# Patient Record
Sex: Male | Born: 1980 | Hispanic: Yes | Marital: Married | Smoking: Never smoker
Health system: Southern US, Community
[De-identification: ages and names within clinical notes are randomized; demographics above are authoritative.]

## PROBLEM LIST (undated history)

## (undated) DIAGNOSIS — Q250 Patent ductus arteriosus: Secondary | ICD-10-CM

## (undated) DIAGNOSIS — E785 Hyperlipidemia, unspecified: Secondary | ICD-10-CM

## (undated) HISTORY — DX: Hyperlipidemia, unspecified: E78.5

## (undated) HISTORY — DX: Patent ductus arteriosus: Q25.0

---

## 2020-04-19 ENCOUNTER — Other Ambulatory Visit: Payer: Self-pay

## 2020-04-19 ENCOUNTER — Emergency Department (HOSPITAL_COMMUNITY)
Admission: EM | Admit: 2020-04-19 | Discharge: 2020-04-19 | Disposition: A | Discharge status: Home / Self Care | Payer: 59 | Attending: Emergency Medicine | Admitting: Emergency Medicine

## 2020-04-19 ENCOUNTER — Emergency Department (HOSPITAL_COMMUNITY): Payer: 59

## 2020-04-19 ENCOUNTER — Encounter (HOSPITAL_COMMUNITY): Payer: Self-pay | Admitting: *Deleted

## 2020-04-19 DIAGNOSIS — R079 Chest pain, unspecified: Secondary | ICD-10-CM

## 2020-04-19 DIAGNOSIS — R0602 Shortness of breath: Secondary | ICD-10-CM | POA: Insufficient documentation

## 2020-04-19 DIAGNOSIS — R0789 Other chest pain: Secondary | ICD-10-CM | POA: Insufficient documentation

## 2020-04-19 LAB — CBC
HCT: 45.6 % (ref 39.0–52.0)
Hemoglobin: 15.5 g/dL (ref 13.0–17.0)
MCH: 30.6 pg (ref 26.0–34.0)
MCHC: 34 g/dL (ref 30.0–36.0)
MCV: 90.1 fL (ref 80.0–100.0)
Platelets: 253 10*3/uL (ref 150–400)
RBC: 5.06 MIL/uL (ref 4.22–5.81)
RDW: 12.7 % (ref 11.5–15.5)
WBC: 6.4 10*3/uL (ref 4.0–10.5)
nRBC: 0 % (ref 0.0–0.2)

## 2020-04-19 LAB — BASIC METABOLIC PANEL
Anion gap: 10 (ref 5–15)
BUN: 9 mg/dL (ref 6–20)
CO2: 24 mmol/L (ref 22–32)
Calcium: 9.2 mg/dL (ref 8.9–10.3)
Chloride: 106 mmol/L (ref 98–111)
Creatinine, Ser: 0.95 mg/dL (ref 0.61–1.24)
GFR, Estimated: >60 mL/min (ref >60)
Glucose, Bld: 161 mg/dL — ABNORMAL HIGH (ref 70–99)
Potassium: 3.6 mmol/L (ref 3.5–5.1)
Sodium: 140 mmol/L (ref 135–145)

## 2020-04-19 LAB — BRAIN NATRIURETIC PEPTIDE: B Natriuretic Peptide: 14.8 pg/mL (ref 0.0–100.0)

## 2020-04-19 LAB — TROPONIN I (HIGH SENSITIVITY)
Troponin I (High Sensitivity): 4 ng/L (ref <18)
Troponin I (High Sensitivity): 3 ng/L (ref <18)

## 2020-04-19 NOTE — ED Triage Notes (Signed)
Used interpretor, pt reports having chest pain x 2 months with cough/sob.

## 2020-04-19 NOTE — ED Notes (Signed)
Patient transported to X-ray, radiology to transport pt to treatment room when finished.

## 2020-04-19 NOTE — ED Notes (Signed)
Patient discharge instructions reviewed with the patient. The patient verbalized understanding of instructions. Patient discharged. 

## 2020-04-19 NOTE — ED Provider Notes (Signed)
[place] EMERGENCY DEPARTMENT Provider Note  CSN: [id number] Arrival date & time: 04/19/20 2440    History Chief Complaint  Patient presents with   Chest Pain    HPI History via interpreter  Julian Dickson is a 40 y.o. male with no significant PMH reports about 2 months of intermittent episodes of chest pain, described as a pressure/squeezing or sharp, comes and goes, lasting about 28min per episodes. He has the pains at least every other day, associated with SOB particularly with walking. Denies any cough or fever but he did have Covid in October prior to these symptoms starting. He recovered from that without issue. He denies any history of HTN, DM or HLD, no smoking, no family history of premature CAD.    History reviewed. No pertinent past medical history.  History reviewed. No pertinent surgical history.  History reviewed. No pertinent family history.  Social History   Tobacco Use   Smoking status: Never Smoker   Smokeless tobacco: Never Used  Substance Use Topics   Alcohol use: Never   Drug use: Never     Home Medications Prior to Admission medications   Not on File     Allergies    Patient has no known allergies.   Review of Systems   Review of Systems A comprehensive review of systems was completed and negative except as noted in HPI.    Physical Exam BP (!) 133/95 (BP Location: Right Arm)    Pulse 69    Temp 98.3 F (36.8 C) (Oral)    Resp 16    SpO2 100%   Physical Exam Vitals and nursing note reviewed.  Constitutional:      Appearance: Normal appearance.  HENT:     Head: Normocephalic and atraumatic.     Nose: Nose normal.     Mouth/Throat:     Mouth: Mucous membranes are moist.  Eyes:     Extraocular Movements: Extraocular movements intact.     Conjunctiva/sclera: Conjunctivae normal.  Cardiovascular:     Rate and Rhythm: Normal rate.  Pulmonary:     Effort: Pulmonary effort is normal.     Breath sounds: Normal breath sounds.   Abdominal:     General: Abdomen is flat.     Palpations: Abdomen is soft.     Tenderness: There is no abdominal tenderness.  Musculoskeletal:        General: No swelling. Normal range of motion.     Cervical back: Neck supple.  Skin:    General: Skin is warm and dry.  Neurological:     General: No focal deficit present.     Mental Status: He is alert.  Psychiatric:        Mood and Affect: Mood normal.      ED Results / Procedures / Treatments   Labs (all labs ordered are listed, but only abnormal results are displayed) Labs Reviewed  BASIC METABOLIC PANEL - Abnormal; Notable for the following components:      Result Value   Glucose, Bld 161 (*)    All other components within normal limits  CBC  BRAIN NATRIURETIC PEPTIDE  TROPONIN I (HIGH SENSITIVITY)  TROPONIN I (HIGH SENSITIVITY)    EKG EKG Interpretation  Date/Time:  Monday April 19 2020 09:31:09 EST Ventricular Rate:  75 PR Interval:  166 QRS Duration: 104 QT Interval:  366 QTC Calculation: 408 R Axis:   -12 Text Interpretation: Normal sinus rhythm Incomplete right bundle branch block ST & T wave abnormality, consider  anterior ischemia Abnormal ECG No old tracing to compare Confirmed by Calvert Cantor [phone]) on 04/19/2020 9:43:47 AM   Radiology DG Chest 2 View  Result Date: 04/19/2020 CLINICAL DATA:  Chest pain EXAM: CHEST - 2 VIEW COMPARISON:  None. FINDINGS: Lungs are clear. Heart size and pulmonary vascularity are normal. No adenopathy. No pneumothorax. No bone lesions. IMPRESSION: Lungs clear.  Cardiac silhouette normal. Electronically Signed   By: Lowella Grip III M.D.   On: 04/19/2020 09:54    Procedures Procedures  Medications Ordered in the ED Medications - No data to display   MDM Rules/Calculators/A&P MDM Patient with symptoms concerning for cardiac etiology of his pain although has been ongoing for about 2 month, less likely to be ACS/MI. Could be late effects of Covid infection in  October as well. Will check labs, CXR. EKG with nonspecific ST-T abnormalities but no old to compare.  ED Course  I have reviewed the triage vital signs and the nursing notes.  Pertinent labs & imaging results that were available during my care of the patient were reviewed by me and considered in my medical decision making (see chart for details).  Clinical Course as of 04/20/20 0746  Mon Apr 19, 2020  1049 CBC is normal.  [CS]  7517 BMP with mild hyperglycemia not fasting. Trop neg x 2, BNP is normal. No signs of ACS or MI. Recommend outpatient follow up with PCP and/or Cardiology for further evaluation. Recommend return to the ED if symptoms worsen or for any other concerns.  [CS]    Clinical Course User Index [CS] Truddie Hidden, MD    Final Clinical Impression(s) / ED Diagnoses Final diagnoses:  Nonspecific chest pain    Rx / DC Orders ED Discharge Orders    None       Truddie Hidden, MD 04/20/20 [phone]

## 2020-05-17 ENCOUNTER — Encounter (HOSPITAL_BASED_OUTPATIENT_CLINIC_OR_DEPARTMENT_OTHER): Payer: Self-pay

## 2020-05-17 ENCOUNTER — Emergency Department (HOSPITAL_BASED_OUTPATIENT_CLINIC_OR_DEPARTMENT_OTHER)
Admission: EM | Admit: 2020-05-17 | Discharge: 2020-05-17 | Disposition: A | Discharge status: Home / Self Care | Payer: 59 | Attending: Emergency Medicine | Admitting: Emergency Medicine

## 2020-05-17 ENCOUNTER — Emergency Department (HOSPITAL_BASED_OUTPATIENT_CLINIC_OR_DEPARTMENT_OTHER): Payer: 59

## 2020-05-17 ENCOUNTER — Other Ambulatory Visit: Payer: Self-pay

## 2020-05-17 DIAGNOSIS — R1032 Left lower quadrant pain: Secondary | ICD-10-CM

## 2020-05-17 DIAGNOSIS — D1803 Hemangioma of intra-abdominal structures: Secondary | ICD-10-CM | POA: Insufficient documentation

## 2020-05-17 DIAGNOSIS — K659 Peritonitis, unspecified: Secondary | ICD-10-CM | POA: Diagnosis not present

## 2020-05-17 DIAGNOSIS — R079 Chest pain, unspecified: Secondary | ICD-10-CM | POA: Diagnosis not present

## 2020-05-17 DIAGNOSIS — K6389 Other specified diseases of intestine: Secondary | ICD-10-CM

## 2020-05-17 LAB — CBC
HCT: 48.9 % (ref 39.0–52.0)
Hemoglobin: 16.2 g/dL (ref 13.0–17.0)
MCH: 29.9 pg (ref 26.0–34.0)
MCHC: 33.1 g/dL (ref 30.0–36.0)
MCV: 90.4 fL (ref 80.0–100.0)
Platelets: 267 10*3/uL (ref 150–400)
RBC: 5.41 MIL/uL (ref 4.22–5.81)
RDW: 12.6 % (ref 11.5–15.5)
WBC: 7.2 10*3/uL (ref 4.0–10.5)
nRBC: 0 % (ref 0.0–0.2)

## 2020-05-17 LAB — COMPREHENSIVE METABOLIC PANEL
ALT: 25 U/L (ref 0–44)
AST: 19 U/L (ref 15–41)
Albumin: 4.6 g/dL (ref 3.5–5.0)
Alkaline Phosphatase: 79 U/L (ref 38–126)
Anion gap: 9 (ref 5–15)
BUN: 12 mg/dL (ref 6–20)
CO2: 27 mmol/L (ref 22–32)
Calcium: 9.3 mg/dL (ref 8.9–10.3)
Chloride: 104 mmol/L (ref 98–111)
Creatinine, Ser: 0.98 mg/dL (ref 0.61–1.24)
GFR, Estimated: >60 mL/min (ref >60)
Glucose, Bld: 131 mg/dL — ABNORMAL HIGH (ref 70–99)
Potassium: 3.6 mmol/L (ref 3.5–5.1)
Sodium: 140 mmol/L (ref 135–145)
Total Bilirubin: 0.5 mg/dL (ref 0.3–1.2)
Total Protein: 7.6 g/dL (ref 6.5–8.1)

## 2020-05-17 LAB — URINALYSIS, ROUTINE W REFLEX MICROSCOPIC
Bilirubin Urine: NEGATIVE
Glucose, UA: NEGATIVE mg/dL
Ketones, ur: NEGATIVE mg/dL
Leukocytes,Ua: NEGATIVE
Nitrite: NEGATIVE
Protein, ur: NEGATIVE mg/dL
Specific Gravity, Urine: >1.03 — ABNORMAL HIGH (ref 1.005–1.030)
pH: 5.5 (ref 5.0–8.0)

## 2020-05-17 LAB — URINALYSIS, MICROSCOPIC (REFLEX)

## 2020-05-17 LAB — TROPONIN I (HIGH SENSITIVITY): Troponin I (High Sensitivity): 2 ng/L (ref <18)

## 2020-05-17 LAB — LIPASE, BLOOD: Lipase: 36 U/L (ref 11–51)

## 2020-05-17 MED ORDER — KETOROLAC TROMETHAMINE 30 MG/ML IJ SOLN
30.0000 mg | Freq: Once | INTRAMUSCULAR | Status: AC
  Administered 2020-05-17: 30 mg via INTRAVENOUS
  Filled 2020-05-17: qty 1

## 2020-05-17 MED ORDER — ONDANSETRON HCL 4 MG/2ML IJ SOLN
4.0000 mg | Freq: Once | INTRAMUSCULAR | Status: AC
  Administered 2020-05-17: 4 mg via INTRAVENOUS
  Filled 2020-05-17: qty 2

## 2020-05-17 MED ORDER — MORPHINE SULFATE (PF) 4 MG/ML IV SOLN
4.0000 mg | Freq: Once | INTRAVENOUS | Status: AC
  Administered 2020-05-17: 4 mg via INTRAVENOUS
  Filled 2020-05-17: qty 1

## 2020-05-17 MED ORDER — IOHEXOL 300 MG/ML  SOLN
100.0000 mL | Freq: Once | INTRAMUSCULAR | Status: AC | PRN
  Administered 2020-05-17: 100 mL via INTRAVENOUS

## 2020-05-17 MED ORDER — SODIUM CHLORIDE 0.9 % IV BOLUS
1000.0000 mL | Freq: Once | INTRAVENOUS | Status: AC
  Administered 2020-05-17: 1000 mL via INTRAVENOUS

## 2020-05-17 NOTE — Discharge Instructions (Signed)
Your CT showed findings of epiploic appendagitis which is inflammation of fat sacs that you have hanging off of your colon. It is recommended that you take 600 mg Ibuprofen every 6-8 hours as needed for pain.   There was also an incidental finding of a mass on your liver that is like benign.   Please follow up with your PCP regarding your ED visit and for recheck of symptoms in 1 week.   Return to the ED for any worsening symptoms

## 2020-05-17 NOTE — ED Notes (Signed)
Patient transported to CT 

## 2020-05-17 NOTE — ED Triage Notes (Addendum)
Through video interpreter-Pt c/o left side abd pain x 3 days diarrhea x "quite some time"-chest pain since Dec 2021-seen by cards PTA-states he is scheduled for cardiac tests next month-was advised to come to ED for abd pain-pt NAD-steady gait

## 2020-05-17 NOTE — ED Provider Notes (Signed)
[place] EMERGENCY DEPARTMENT Provider Note   CSN: [id number] Arrival date & time: 05/17/20  1720     History Chief Complaint  Patient presents with  . Abdominal Pain  . Chest Pain    Julian Dickson is a 40 y.o. male who presents to the ED today from cardiology office with complaint of left flank/LLQ abdominal pain for the past 3 days. Pt was at the cardiologist office today for chest pain for months when he mentioned he was having abdominal pain as well and was advised to come to the ED for further evaluation. No other complaints at this time. He reports history of kidney stones however feels like is different pain. He has chronic intermittent issues with urinary urgency however denies any worsening symptoms with this pain. No fevers, chills, nausea, vomiting, diarrhea, testicular pain, or any other associated symptoms.   The history is provided by the patient and medical records.       History reviewed. No pertinent past medical history.  There are no problems to display for this patient.   History reviewed. No pertinent surgical history.     No family history on file.  Social History   Tobacco Use  . Smoking status: Never Smoker  . Smokeless tobacco: Never Used  Vaping Use  . Vaping Use: Never used  Substance Use Topics  . Alcohol use: Never  . Drug use: Never    Home Medications Prior to Admission medications   Not on File    Allergies    Patient has no known allergies.  Review of Systems   Review of Systems  Constitutional: Negative for chills and fever.  Gastrointestinal: Positive for abdominal pain. Negative for diarrhea, nausea and vomiting.  Genitourinary: Positive for flank pain.  All other systems reviewed and are negative.   Physical Exam Updated Vital Signs BP (!) 153/102 (BP Location: Right Arm)   Pulse 66   Temp 98.8 F (37.1 C) (Oral)   Resp 14   Ht 5\' 7"  (1.702 m)   Wt 66.7 kg   SpO2 99%   BMI 23.02 kg/m   Physical  Exam Vitals and nursing note reviewed.  Constitutional:      Appearance: He is not ill-appearing or diaphoretic.  HENT:     Head: Normocephalic and atraumatic.  Eyes:     Conjunctiva/sclera: Conjunctivae normal.  Cardiovascular:     Rate and Rhythm: Normal rate and regular rhythm.     Heart sounds: Normal heart sounds.  Pulmonary:     Effort: Pulmonary effort is normal.     Breath sounds: Normal breath sounds. No wheezing, rhonchi or rales.  Abdominal:     Palpations: Abdomen is soft.     Tenderness: There is abdominal tenderness in the left lower quadrant. There is no guarding or rebound.     Comments: Soft, + LLQ and left flank TTP with very minimal left CVA TTP, +BS throughout, no r/g/r, neg murphy's, neg mcburney's  Musculoskeletal:     Cervical back: Neck supple.  Skin:    General: Skin is warm and dry.  Neurological:     Mental Status: He is alert.     ED Results / Procedures / Treatments   Labs (all labs ordered are listed, but only abnormal results are displayed) Labs Reviewed  COMPREHENSIVE METABOLIC PANEL - Abnormal; Notable for the following components:      Result Value   Glucose, Bld 131 (*)    All other components within normal limits  URINALYSIS, ROUTINE W REFLEX MICROSCOPIC - Abnormal; Notable for the following components:   Specific Gravity, Urine >1.030 (*)    Hgb urine dipstick SMALL (*)    All other components within normal limits  URINALYSIS, MICROSCOPIC (REFLEX) - Abnormal; Notable for the following components:   Bacteria, UA RARE (*)    All other components within normal limits  LIPASE, BLOOD  CBC  TROPONIN I (HIGH SENSITIVITY)    EKG EKG Interpretation  Date/Time:  Monday May 17 2020 18:05:43 EDT Ventricular Rate:  72 PR Interval:  162 QRS Duration: 94 QT Interval:  400 QTC Calculation: 438 R Axis:   -25 Text Interpretation: Normal sinus rhythm with sinus arrhythmia Normal ECG No significant change since last tracing Confirmed by  Calvert Cantor [phone]) on 05/17/2020 7:32:37 PM   Radiology CT Abdomen Pelvis W Contrast  Result Date: 05/17/2020 CLINICAL DATA:  Left lower quadrant pain EXAM: CT ABDOMEN AND PELVIS WITH CONTRAST TECHNIQUE: Multidetector CT imaging of the abdomen and pelvis was performed using the standard protocol following bolus administration of intravenous contrast. CONTRAST:  172mL OMNIPAQUE IOHEXOL 300 MG/ML  SOLN COMPARISON:  None. FINDINGS: Lower chest: Lung bases demonstrate no acute consolidation or effusion. Normal cardiac size. Hepatobiliary: 3.7 x 3.8 cm hypodense mass within the right hepatic lobe with globular peripheral enhancement, felt most consistent with hemangioma. Hepatic steatosis with fat sparing near the gallbladder fossa. Subcentimeter hypodense liver lesions too small to further characterize. No calcified gallstone or biliary dilatation. Pancreas: Unremarkable. No pancreatic ductal dilatation or surrounding inflammatory changes. Spleen: Normal in size without focal abnormality. Adrenals/Urinary Tract: Adrenal glands are unremarkable. Kidneys are normal, without renal calculi, focal lesion, or hydronephrosis. Bladder is unremarkable. Stomach/Bowel: The stomach is nonenlarged. No dilated small bowel. Negative appendix. Focal inflammatory process adjacent to the descending colon. This appears to contain central fatty density. Negative for extraluminal gas. Vascular/Lymphatic: Nonaneurysmal aorta.  No suspicious adenopathy Reproductive: Prostate is unremarkable. Other: Negative for free air or free fluid Musculoskeletal: No acute or significant osseous findings. IMPRESSION: 1. Focal inflammatory process adjacent to the descending colon, appearance is suspicious for epiploic appendagitis. 2. Hepatic steatosis. 3.7 cm hypodense mass in the right hepatic lobe with globular peripheral enhancement, felt most consistent with hemangioma. Subcentimeter hypodense liver lesions too small to further characterize.  Electronically Signed   By: Donavan Foil M.D.   On: 05/17/2020 19:57    Procedures Procedures   Medications Ordered in ED Medications  ketorolac (TORADOL) 30 MG/ML injection 30 mg (has no administration in time range)  morphine 4 MG/ML injection 4 mg (4 mg Intravenous Given 05/17/20 1924)  iohexol (OMNIPAQUE) 300 MG/ML solution 100 mL (100 mLs Intravenous Contrast Given 05/17/20 1931)  sodium chloride 0.9 % bolus 1,000 mL (1,000 mLs Intravenous New Bag/Given 05/17/20 1950)  ondansetron (ZOFRAN) injection 4 mg (4 mg Intravenous Given 05/17/20 2007)    ED Course  I have reviewed the triage vital signs and the nursing notes.  Pertinent labs & imaging results that were available during my care of the patient were reviewed by me and considered in my medical decision making (see chart for details).    MDM Rules/Calculators/A&P                          40 year old [place] speaking male who presents to the ED today with cardiology after complaining of left flank/left lower quadrant pain for the past 3 days.  Advised to come here for further evaluation.  Denies  any other symptoms.  On arrival to the ED vitals are stable.  Patient is afebrile, nontachycardic and nontachypneic and appears to be in no acute distress.  He had lab work done while in the waiting room including an EKG and troponin given he was at the cardiologist office with complaint of chest pain however this has been ongoing for several months.  CBC without leukocytosis and hemoglobin stable at 6.2.  CMP with a glucose 131, no other electrolyte abnormalities.  Lipase within normal limits at 36.  Urinalysis with increased specific gravity, small hemoglobin per dipstick however none seen on high-power field.  Patient does report history of kidney stones however states this pain feels completely different.  He has had to have stones removed via stenting in the past.  He has chronic intermittent issues with urinary urgency however denies any  worsening symptoms today.  On exam patient is noted to be tender in the left lower quadrant as well as the left flank.  Very minimal tenderness to the left CVA area.  Differential diagnosis includes kidney stone versus diverticulitis.  Given lipase is of the normal limits I doubt pancreatitis today.  Patient denies heavy drinking.  Will work-up with CT given and pelvis for further evaluation.  Will provide fluids given increased [place] gravity as well as morphine for pain.    CT scan: IMPRESSION:  1. Focal inflammatory process adjacent to the descending colon,  appearance is suspicious for epiploic appendagitis.  2. Hepatic steatosis. 3.7 cm hypodense mass in the right hepatic  lobe with globular peripheral enhancement, felt most consistent with  hemangioma. Subcentimeter hypodense liver lesions too small to  further characterize.   Toradol provided for antiinflammatory purposes. Updated patient on findings. Advised Ibuprofen as needed for pain. Pt to follow up with his PCP regarding ED visit today. He is in agreement with plan and stable for discharge home.   Final Clinical Impression(s) / ED Diagnoses Final diagnoses:  Left lower quadrant abdominal pain  Epiploic appendagitis  Hemangioma of intra-abdominal structure    Rx / DC Orders ED Discharge Orders    None       Discharge Instructions     Your CT showed findings of epiploic appendagitis which is inflammation of fat sacs that you have hanging off of your colon. It is recommended that you take 600 mg Ibuprofen every 6-8 hours as needed for pain.   There was also an incidental finding of a mass on your liver that is like benign.   Please follow up with your PCP regarding your ED visit and for recheck of symptoms in 1 week.   Return to the ED for any worsening symptoms       Eustaquio Maize, Hershal Coria 05/17/20 2045    Truddie Hidden, MD 05/17/20 2300

## 2020-11-24 ENCOUNTER — Emergency Department (HOSPITAL_BASED_OUTPATIENT_CLINIC_OR_DEPARTMENT_OTHER)
Admission: EM | Admit: 2020-11-24 | Discharge: 2020-11-25 | Disposition: A | Discharge status: Home / Self Care | Payer: 59 | Attending: Emergency Medicine | Admitting: Emergency Medicine

## 2020-11-24 ENCOUNTER — Emergency Department (HOSPITAL_BASED_OUTPATIENT_CLINIC_OR_DEPARTMENT_OTHER): Payer: 59 | Admitting: Radiology

## 2020-11-24 ENCOUNTER — Encounter (HOSPITAL_BASED_OUTPATIENT_CLINIC_OR_DEPARTMENT_OTHER): Payer: Self-pay

## 2020-11-24 ENCOUNTER — Other Ambulatory Visit: Payer: Self-pay

## 2020-11-24 DIAGNOSIS — R109 Unspecified abdominal pain: Secondary | ICD-10-CM | POA: Diagnosis not present

## 2020-11-24 DIAGNOSIS — R072 Precordial pain: Secondary | ICD-10-CM | POA: Diagnosis not present

## 2020-11-24 DIAGNOSIS — R911 Solitary pulmonary nodule: Secondary | ICD-10-CM

## 2020-11-24 LAB — CBC
HCT: 42.9 % (ref 39.0–52.0)
Hemoglobin: 14.4 g/dL (ref 13.0–17.0)
MCH: 29.9 pg (ref 26.0–34.0)
MCHC: 33.6 g/dL (ref 30.0–36.0)
MCV: 89.2 fL (ref 80.0–100.0)
Platelets: 254 10*3/uL (ref 150–400)
RBC: 4.81 MIL/uL (ref 4.22–5.81)
RDW: 12.9 % (ref 11.5–15.5)
WBC: 8.4 10*3/uL (ref 4.0–10.5)
nRBC: 0 % (ref 0.0–0.2)

## 2020-11-24 LAB — COMPREHENSIVE METABOLIC PANEL
ALT: 13 U/L (ref 0–44)
AST: 15 U/L (ref 15–41)
Albumin: 4.3 g/dL (ref 3.5–5.0)
Alkaline Phosphatase: 67 U/L (ref 38–126)
Anion gap: 12 (ref 5–15)
BUN: 13 mg/dL (ref 6–20)
CO2: 23 mmol/L (ref 22–32)
Calcium: 9.3 mg/dL (ref 8.9–10.3)
Chloride: 108 mmol/L (ref 98–111)
Creatinine, Ser: 0.95 mg/dL (ref 0.61–1.24)
GFR, Estimated: >60 mL/min (ref >60)
Glucose, Bld: 138 mg/dL — ABNORMAL HIGH (ref 70–99)
Potassium: 3.7 mmol/L (ref 3.5–5.1)
Sodium: 143 mmol/L (ref 135–145)
Total Bilirubin: 0.8 mg/dL (ref 0.3–1.2)
Total Protein: 6.6 g/dL (ref 6.5–8.1)

## 2020-11-24 LAB — LIPASE, BLOOD: Lipase: 24 U/L (ref 11–51)

## 2020-11-24 LAB — TROPONIN I (HIGH SENSITIVITY): Troponin I (High Sensitivity): 2 ng/L (ref <18)

## 2020-11-24 MED ORDER — ACETAMINOPHEN 500 MG PO TABS
1000.0000 mg | ORAL_TABLET | Freq: Once | ORAL | Status: AC
  Administered 2020-11-24: 1000 mg via ORAL
  Filled 2020-11-24: qty 2

## 2020-11-24 MED ORDER — ALUM & MAG HYDROXIDE-SIMETH 200-200-20 MG/5ML PO SUSP
30.0000 mL | Freq: Once | ORAL | Status: AC
  Administered 2020-11-24: 30 mL via ORAL
  Filled 2020-11-24: qty 30

## 2020-11-24 NOTE — ED Triage Notes (Signed)
Interpreter at Bedside during Triage:  Patient here POV from Home with CP and ABD Pain.  Patient states he began having CP approximately 2 weeks Ago. Pain is located across the Chest and is also located in the upper Portions in the ABD. Pain feels as if Pins and Needles.  Patient has Hx of Stable Angina. A&Ox4. GCS 15. Wife at Bedside.

## 2020-11-25 ENCOUNTER — Encounter (HOSPITAL_BASED_OUTPATIENT_CLINIC_OR_DEPARTMENT_OTHER): Payer: Self-pay | Admitting: Emergency Medicine

## 2020-11-25 LAB — TROPONIN I (HIGH SENSITIVITY): Troponin I (High Sensitivity): <2 ng/L (ref <18)

## 2020-11-25 MED ORDER — OMEPRAZOLE 20 MG PO CPDR: 20.0000 mg | DELAYED_RELEASE_CAPSULE | Freq: Every day | ORAL | 0 refills | Status: DC

## 2020-11-25 NOTE — ED Provider Notes (Signed)
[facility] EMERGENCY DEPT Provider Note   CSN: [id number] Arrival date & time: 11/24/20  1957     History Chief Complaint  Patient presents with   Chest Pain   Abdominal Pain    Julian Dickson is a 40 y.o. male.  The history is provided by the patient and a relative. The history is limited by a language barrier. No language interpreter was used (service is down at this time, attempted to wait but did not come back up).  Chest Pain Pain location:  Substernal area Pain quality: dull   Pain radiates to:  Does not radiate Pain severity:  Moderate Onset quality:  Gradual Duration:  10 months Timing:  Constant Progression:  Waxing and waning Chronicity:  Chronic Context: at rest   Context comment:  Worse at night Relieved by:  Nothing Worsened by:  Nothing Ineffective treatments:  None tried Associated symptoms: no fever, no lower extremity edema and no vomiting   Risk factors: male sex   No travel, no leg pain.      History reviewed. No pertinent past medical history.  There are no problems to display for this patient.   History reviewed. No pertinent surgical history.     History reviewed. No pertinent family history.  Social History   Tobacco Use   Smoking status: Never   Smokeless tobacco: Never  Vaping Use   Vaping Use: Never used  Substance Use Topics   Alcohol use: Never   Drug use: Never    Home Medications Prior to Admission medications   Not on File    Allergies    Patient has no known allergies.  Review of Systems   Review of Systems  Constitutional:  Negative for fever.  HENT:  Negative for facial swelling.   Eyes:  Negative for redness.  Respiratory:  Negative for wheezing and stridor.   Cardiovascular:  Positive for chest pain.  Gastrointestinal:  Negative for vomiting.  Musculoskeletal:  Negative for neck stiffness.  Neurological:  Negative for facial asymmetry.  Psychiatric/Behavioral:  Negative for agitation.   All  other systems reviewed and are negative.  Physical Exam Updated Vital Signs BP (!) 126/91   Pulse 69   Temp 98.2 F (36.8 C) (Oral)   Resp (!) 26   Ht 5\' 7"  (1.702 m)   Wt 66.7 kg   SpO2 100%   BMI 23.03 kg/m   Physical Exam Vitals and nursing note reviewed.  Constitutional:      General: Julian Dickson is not in acute distress.    Appearance: Normal appearance.  HENT:     Head: Normocephalic and atraumatic.     Nose: Nose normal.  Eyes:     Conjunctiva/sclera: Conjunctivae normal.     Pupils: Pupils are equal, round, and reactive to light.  Cardiovascular:     Rate and Rhythm: Normal rate and regular rhythm.     Pulses: Normal pulses.     Heart sounds: Normal heart sounds.  Pulmonary:     Effort: Pulmonary effort is normal.     Breath sounds: Normal breath sounds.  Abdominal:     General: Abdomen is flat. Bowel sounds are normal.     Palpations: Abdomen is soft.     Tenderness: There is no abdominal tenderness. There is no guarding.  Musculoskeletal:        General: Normal range of motion.     Cervical back: Normal range of motion and neck supple.  Skin:    General: Skin is warm  and dry.     Capillary Refill: Capillary refill takes less than 2 seconds.  Neurological:     General: No focal deficit present.     Mental Status: Julian Dickson is alert and oriented to person, place, and time.     Deep Tendon Reflexes: Reflexes normal.  Psychiatric:        Mood and Affect: Mood normal.        Behavior: Behavior normal.    ED Results / Procedures / Treatments   Labs (all labs ordered are listed, but only abnormal results are displayed) Results for orders placed or performed during the hospital encounter of 11/24/20  Lipase, blood  Result Value Ref Range   Lipase 24 11 - 51 U/L  Comprehensive metabolic panel  Result Value Ref Range   Sodium 143 135 - 145 mmol/L   Potassium 3.7 3.5 - 5.1 mmol/L   Chloride 108 98 - 111 mmol/L   CO2 23 22 - 32 mmol/L   Glucose, Bld 138 (H) 70 - 99  mg/dL   BUN 13 6 - 20 mg/dL   Creatinine, Ser 0.95 0.61 - 1.24 mg/dL   Calcium 9.3 8.9 - 10.3 mg/dL   Total Protein 6.6 6.5 - 8.1 g/dL   Albumin 4.3 3.5 - 5.0 g/dL   AST 15 15 - 41 U/L   ALT 13 0 - 44 U/L   Alkaline Phosphatase 67 38 - 126 U/L   Total Bilirubin 0.8 0.3 - 1.2 mg/dL   GFR, Estimated >60 >60 mL/min   Anion gap 12 5 - 15  CBC  Result Value Ref Range   WBC 8.4 4.0 - 10.5 K/uL   RBC 4.81 4.22 - 5.81 MIL/uL   Hemoglobin 14.4 13.0 - 17.0 g/dL   HCT 42.9 39.0 - 52.0 %   MCV 89.2 80.0 - 100.0 fL   MCH 29.9 26.0 - 34.0 pg   MCHC 33.6 30.0 - 36.0 g/dL   RDW 12.9 11.5 - 15.5 %   Platelets 254 150 - 400 K/uL   nRBC 0.0 0.0 - 0.2 %  Troponin I (High Sensitivity)  Result Value Ref Range   Troponin I (High Sensitivity) 2 <18 ng/L  Troponin I (High Sensitivity)  Result Value Ref Range   Troponin I (High Sensitivity) <2 <18 ng/L   DG Chest 2 View  Result Date: 11/24/2020 CLINICAL DATA:  Chest pain. EXAM: CHEST - 2 VIEW COMPARISON:  Chest x-ray 04/19/2020. FINDINGS: The heart size and mediastinal contours are within normal limits. There is no focal lung infiltrate. There is no pleural effusion or pneumothorax. There is scarring in both lung apices, unchanged. Nodular density in the left lower lung may represent a nipple shadow, but is indeterminate. The visualized skeletal structures are unremarkable. IMPRESSION: 1. No acute cardiopulmonary process. 2. Questionable nodular density in the left lower lung, possibly a nipple shadow. Follow-up repeat chest x-ray with nipple markers or follow-up chest CT recommended non emergently to exclude pulmonary nodule. Electronically Signed   By: Ronney Asters M.D.   On: 11/24/2020 20:35    EKG EKG Interpretation  Date/Time:  Wednesday November 24 2020 20:06:01 EDT Ventricular Rate:  72 PR Interval:  164 QRS Duration: 100 QT Interval:  394 QTC Calculation: 431 R Axis:   -2 Text Interpretation: Normal sinus rhythm Incomplete right bundle  branch block Confirmed by Randal Buba, Ori Kreiter ([id number]) on 11/24/2020 11:14:55 PM  Radiology DG Chest 2 View  Result Date: 11/24/2020 CLINICAL DATA:  Chest pain. EXAM: CHEST - 2  VIEW COMPARISON:  Chest x-ray 04/19/2020. FINDINGS: The heart size and mediastinal contours are within normal limits. There is no focal lung infiltrate. There is no pleural effusion or pneumothorax. There is scarring in both lung apices, unchanged. Nodular density in the left lower lung may represent a nipple shadow, but is indeterminate. The visualized skeletal structures are unremarkable. IMPRESSION: 1. No acute cardiopulmonary process. 2. Questionable nodular density in the left lower lung, possibly a nipple shadow. Follow-up repeat chest x-ray with nipple markers or follow-up chest CT recommended non emergently to exclude pulmonary nodule. Electronically Signed   By: Ronney Asters M.D.   On: 11/24/2020 20:35    Procedures Procedures   Medications Ordered in ED Medications  alum & mag hydroxide-simeth (MAALOX/MYLANTA) 200-200-20 MG/5ML suspension 30 mL (30 mLs Oral Given 11/24/20 2351)  acetaminophen (TYLENOL) tablet 1,000 mg (1,000 mg Oral Given 11/24/20 2351)    ED Course  I have reviewed the triage vital signs and the nursing notes.  Pertinent labs & imaging results that were available during my care of the patient were reviewed by me and considered in my medical decision making (see chart for details).    PERC negative wells 0, highly doubt PE in this low risk patient.  Ruled out for MI based on time course.  Based on symptoms being daily and worse at night I suspect this is GERD.  HEART score is 1 low risk for MACE.  Will start PPI and have patient follow up with PMD.    Bertha Earwood was evaluated in Emergency Department on 11/25/2020 for the symptoms described in the history of present illness. Julian Dickson was evaluated in the context of the global COVID-19 pandemic, which necessitated consideration that the patient might be at  risk for infection with the SARS-CoV-2 virus that causes COVID-19. Institutional protocols and algorithms that pertain to the evaluation of patients at risk for COVID-19 are in a state of rapid change based on information released by regulatory bodies including the CDC and federal and state organizations. These policies and algorithms were followed during the patient's care in the ED.  Final Clinical Impression(s) / ED Diagnoses Final diagnoses:  None   Return for intractable cough, coughing up blood, fevers > 100.4 unrelieved by medication, shortness of breath, intractable vomiting, chest pain, shortness of breath, weakness, numbness, changes in speech, facial asymmetry, abdominal pain, passing out, Inability to tolerate liquids or food, cough, altered mental status or any concerns. No signs of systemic illness or infection. The patient is nontoxic-appearing on exam and vital signs are within normal limits. I have reviewed the triage vital signs and the nursing notes. Pertinent labs & imaging results that were available during my care of the patient were reviewed by me and considered in my medical decision making (see chart for details). After history, exam, and medical workup I feel the patient has been appropriately medically screened and is safe for discharge home. Pertinent diagnoses were discussed with the patient. Patient was given return precautions.   Rx / DC Orders ED Discharge Orders     None        Jaysten Essner, MD 11/25/20 [phone]

## 2020-12-24 ENCOUNTER — Encounter (HOSPITAL_COMMUNITY): Payer: Self-pay | Admitting: Radiology

## 2021-03-11 ENCOUNTER — Other Ambulatory Visit (HOSPITAL_BASED_OUTPATIENT_CLINIC_OR_DEPARTMENT_OTHER): Payer: Self-pay | Admitting: Family Medicine

## 2021-03-11 DIAGNOSIS — K7689 Other specified diseases of liver: Secondary | ICD-10-CM

## 2022-02-03 DIAGNOSIS — Z419 Encounter for procedure for purposes other than remedying health state, unspecified: Secondary | ICD-10-CM | POA: Diagnosis not present

## 2022-03-06 DIAGNOSIS — Z419 Encounter for procedure for purposes other than remedying health state, unspecified: Secondary | ICD-10-CM | POA: Diagnosis not present

## 2022-04-06 DIAGNOSIS — Z419 Encounter for procedure for purposes other than remedying health state, unspecified: Secondary | ICD-10-CM | POA: Diagnosis not present

## 2022-05-05 DIAGNOSIS — Z419 Encounter for procedure for purposes other than remedying health state, unspecified: Secondary | ICD-10-CM | POA: Diagnosis not present

## 2022-06-05 DIAGNOSIS — Z419 Encounter for procedure for purposes other than remedying health state, unspecified: Secondary | ICD-10-CM | POA: Diagnosis not present

## 2022-07-05 DIAGNOSIS — Z419 Encounter for procedure for purposes other than remedying health state, unspecified: Secondary | ICD-10-CM | POA: Diagnosis not present

## 2022-07-14 NOTE — Progress Notes (Deleted)
   HPI: Julian Dickson is a 42 y.o. male, who is here today to establish care.  Former PCP: *** Last preventive routine visit: ***  Chronic medical problems: ***  Concerns today: ***  Review of Systems See other pertinent positives and negatives in HPI.  Current Outpatient Medications on File Prior to Visit  Medication Sig Dispense Refill   omeprazole (PRILOSEC) 20 MG capsule Take 1 capsule (20 mg total) by mouth daily. 30 capsule 0   No current facility-administered medications on file prior to visit.    No past medical history on file. No Known Allergies  No family history on file.  Social History   Socioeconomic History   Marital status: Married    Spouse name: Not on file   Number of children: Not on file   Years of education: Not on file   Highest education level: Not on file  Occupational History   Not on file  Tobacco Use   Smoking status: Never   Smokeless tobacco: Never  Vaping Use   Vaping Use: Never used  Substance and Sexual Activity   Alcohol use: Never   Drug use: Never   Sexual activity: Not on file  Other Topics Concern   Not on file  Social History Narrative   Not on file   Social Determinants of Health   Financial Resource Strain: Not on file  Food Insecurity: Not on file  Transportation Needs: Not on file  Physical Activity: Not on file  Stress: Not on file  Social Connections: Not on file    There were no vitals filed for this visit.  There is no height or weight on file to calculate BMI.  Physical Exam  ASSESSMENT AND PLAN: There are no diagnoses linked to this encounter.  There are no diagnoses linked to this encounter.  No follow-ups on file.  Betty G. [place], MD  [facility] office.

## 2022-07-17 ENCOUNTER — Ambulatory Visit: Payer: Medicaid Other | Admitting: Family Medicine

## 2022-08-05 DIAGNOSIS — Z419 Encounter for procedure for purposes other than remedying health state, unspecified: Secondary | ICD-10-CM | POA: Diagnosis not present

## 2022-08-23 ENCOUNTER — Ambulatory Visit: Payer: Medicaid Other | Admitting: Family Medicine

## 2022-09-04 DIAGNOSIS — Z419 Encounter for procedure for purposes other than remedying health state, unspecified: Secondary | ICD-10-CM | POA: Diagnosis not present

## 2022-09-26 IMAGING — DX DG CHEST 2V
2 series · 2 of 2 positions shown · non-contrast
Comparison: None.

CLINICAL DATA: Chest pain

EXAM:
CHEST - 2 VIEW

[chest pa]
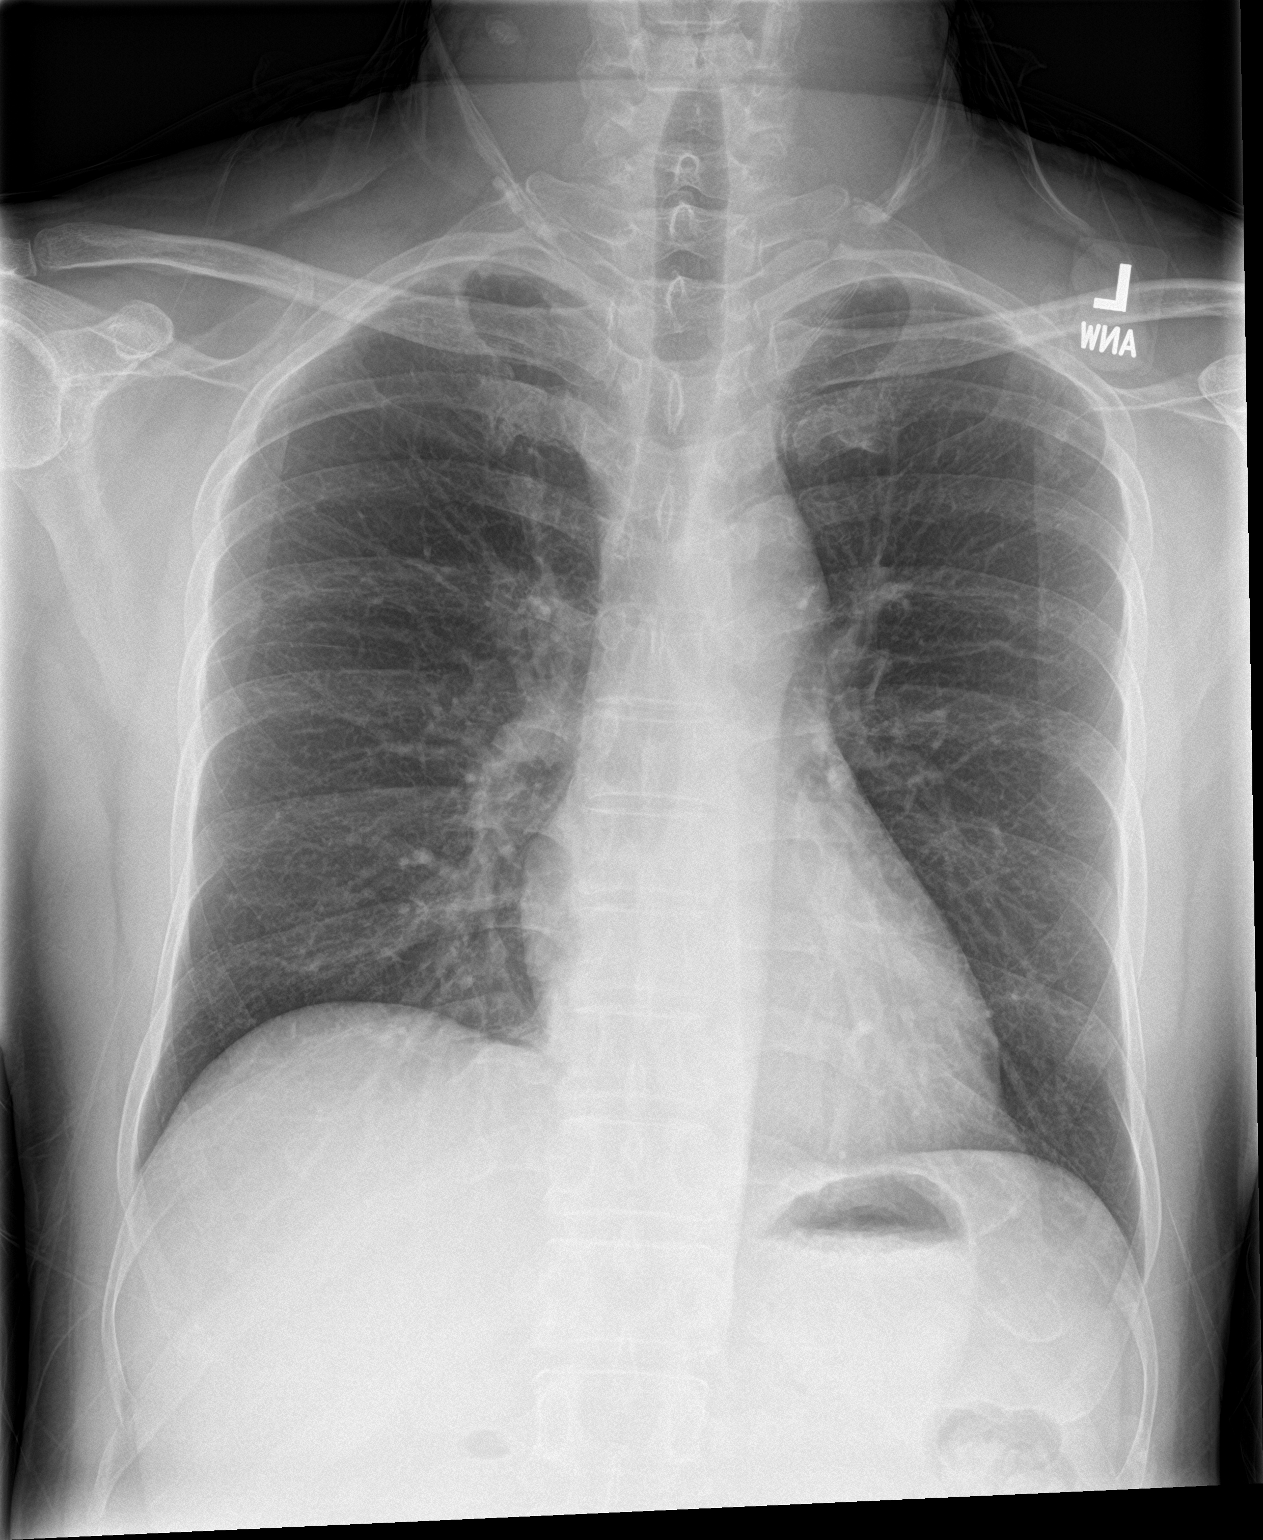

[chest lat]
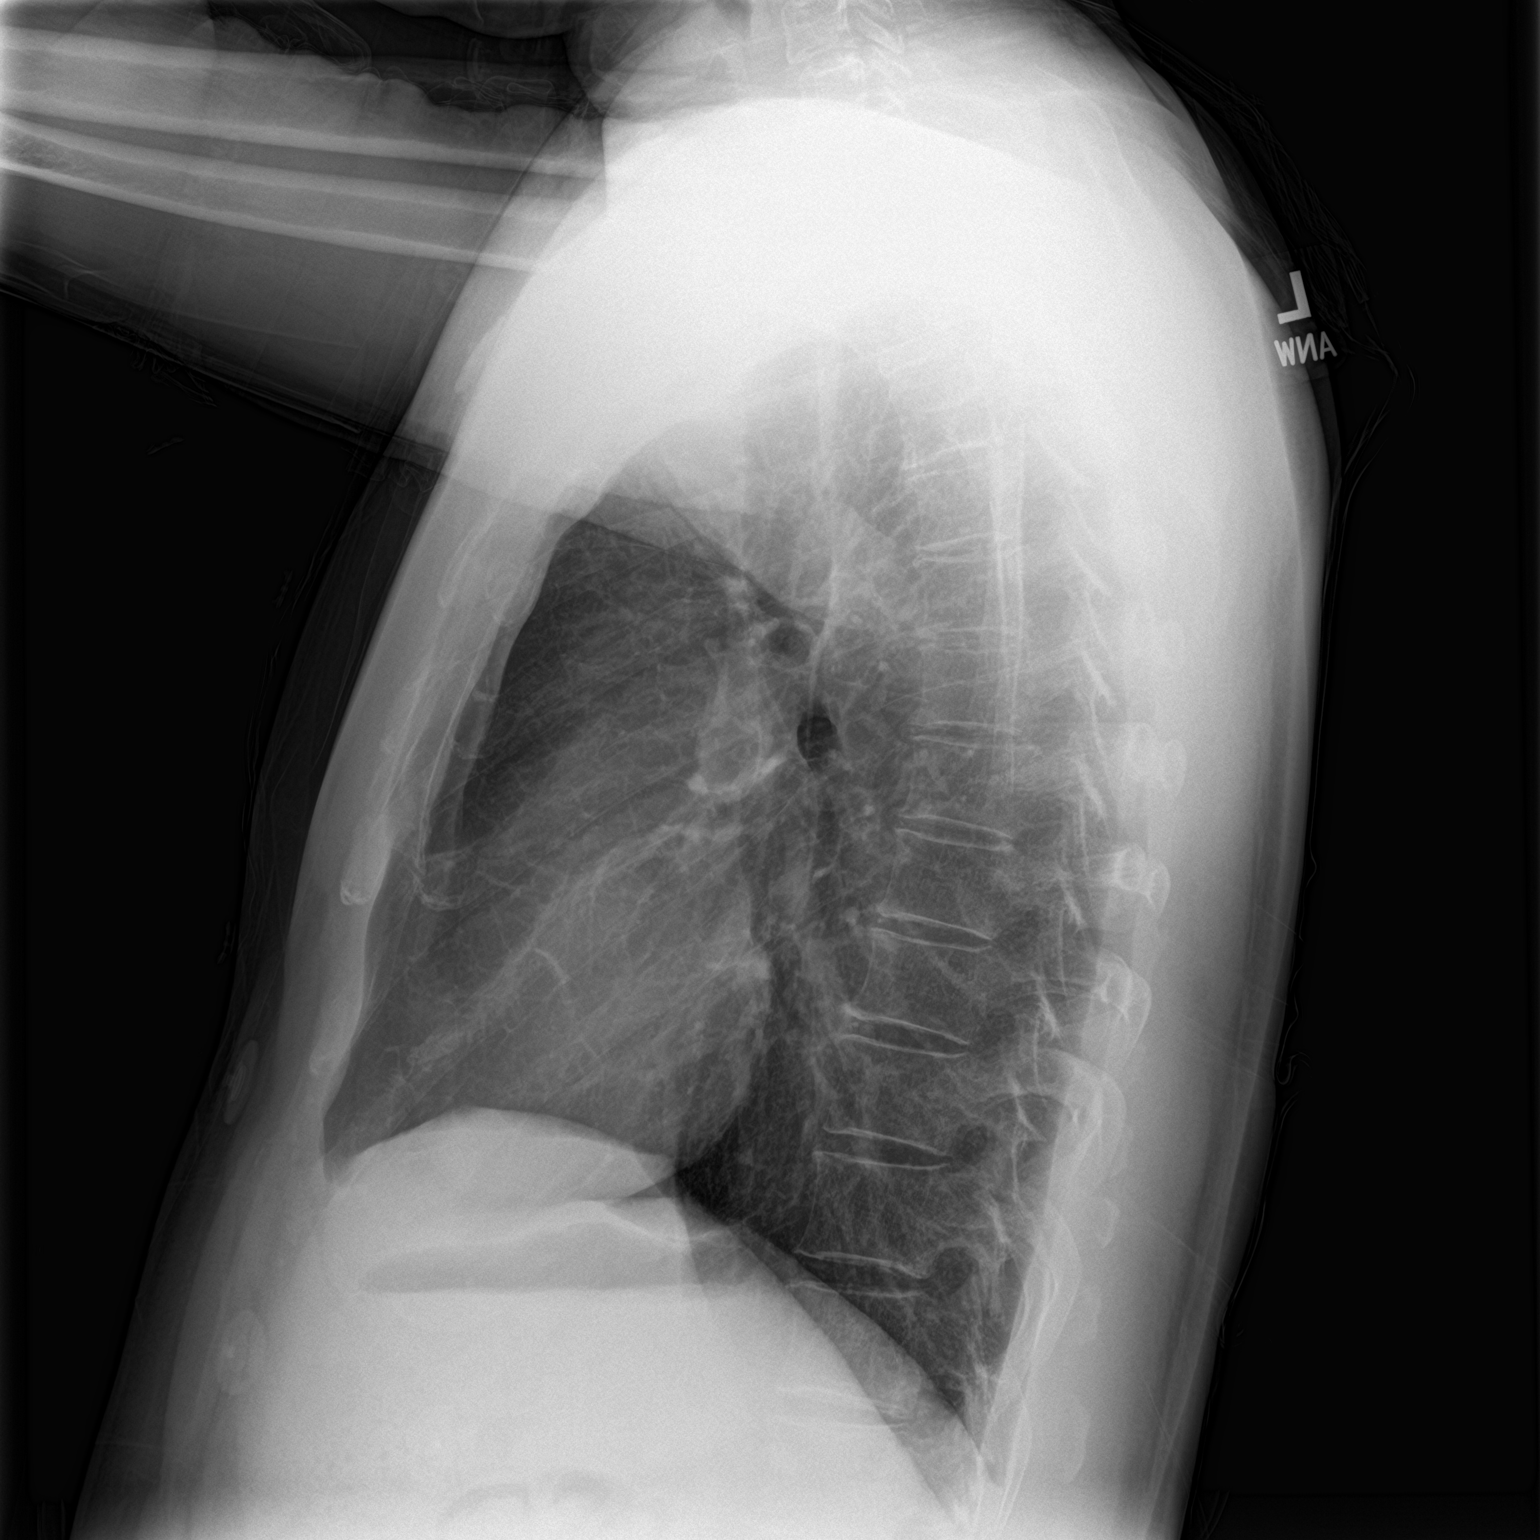

[2 of 2 positions shown; findings below may reference images not displayed]

FINDINGS: Lungs are clear. Heart size and pulmonary vascularity are normal. No
adenopathy. No pneumothorax. No bone lesions.
IMPRESSION: Lungs clear.  Cardiac silhouette normal.

## 2022-10-02 ENCOUNTER — Encounter: Payer: Self-pay | Admitting: Family Medicine

## 2022-10-02 ENCOUNTER — Ambulatory Visit (INDEPENDENT_AMBULATORY_CARE_PROVIDER_SITE_OTHER): Payer: Medicaid Other

## 2022-10-02 ENCOUNTER — Ambulatory Visit (INDEPENDENT_AMBULATORY_CARE_PROVIDER_SITE_OTHER): Payer: Medicaid Other | Admitting: Family Medicine

## 2022-10-02 VITALS — BP 120/80 | HR 75 | Temp 98.3°F | Ht 67.0 in | Wt 145.0 lb

## 2022-10-02 DIAGNOSIS — R0789 Other chest pain: Secondary | ICD-10-CM

## 2022-10-02 DIAGNOSIS — M25562 Pain in left knee: Secondary | ICD-10-CM

## 2022-10-02 DIAGNOSIS — F419 Anxiety disorder, unspecified: Secondary | ICD-10-CM

## 2022-10-02 DIAGNOSIS — E782 Mixed hyperlipidemia: Secondary | ICD-10-CM | POA: Diagnosis not present

## 2022-10-02 DIAGNOSIS — M25561 Pain in right knee: Secondary | ICD-10-CM

## 2022-10-02 DIAGNOSIS — M810 Age-related osteoporosis without current pathological fracture: Secondary | ICD-10-CM | POA: Diagnosis not present

## 2022-10-02 DIAGNOSIS — M25461 Effusion, right knee: Secondary | ICD-10-CM | POA: Diagnosis not present

## 2022-10-02 LAB — CBC WITH DIFFERENTIAL/PLATELET
Basophils Absolute: 0 10*3/uL (ref 0.0–0.1)
Basophils Relative: 0.6 % (ref 0.0–3.0)
Eosinophils Absolute: 0.2 10*3/uL (ref 0.0–0.7)
Eosinophils Relative: 2.5 % (ref 0.0–5.0)
HCT: 46.7 % (ref 39.0–52.0)
Hemoglobin: 15.2 g/dL (ref 13.0–17.0)
Lymphocytes Relative: 39.9 % (ref 12.0–46.0)
Lymphs Abs: 2.5 10*3/uL (ref 0.7–4.0)
MCHC: 32.6 g/dL (ref 30.0–36.0)
MCV: 90.7 fl (ref 78.0–100.0)
Monocytes Absolute: 0.4 10*3/uL (ref 0.1–1.0)
Monocytes Relative: 6 % (ref 3.0–12.0)
Neutro Abs: 3.2 10*3/uL (ref 1.4–7.7)
Neutrophils Relative %: 51 % (ref 43.0–77.0)
Platelets: 277 10*3/uL (ref 150.0–400.0)
RBC: 5.15 Mil/uL (ref 4.22–5.81)
RDW: 13.6 % (ref 11.5–15.5)
WBC: 6.3 10*3/uL (ref 4.0–10.5)

## 2022-10-02 LAB — COMPREHENSIVE METABOLIC PANEL
ALT: 28 U/L (ref 0–53)
AST: 19 U/L (ref 0–37)
Albumin: 4.8 g/dL (ref 3.5–5.2)
Alkaline Phosphatase: 97 U/L (ref 39–117)
BUN: 11 mg/dL (ref 6–23)
CO2: 24 mEq/L (ref 19–32)
Calcium: 9.7 mg/dL (ref 8.4–10.5)
Chloride: 105 mEq/L (ref 96–112)
Creatinine, Ser: 0.96 mg/dL (ref 0.40–1.50)
GFR: 96.17 mL/min (ref >60.00)
Glucose, Bld: 87 mg/dL (ref 70–99)
Potassium: 3.8 mEq/L (ref 3.5–5.1)
Sodium: 140 mEq/L (ref 135–145)
Total Bilirubin: 1.2 mg/dL (ref 0.2–1.2)
Total Protein: 7.3 g/dL (ref 6.0–8.3)

## 2022-10-02 LAB — LIPID PANEL
Cholesterol: 198 mg/dL (ref 0–200)
HDL: 52.3 mg/dL (ref >39.00)
NonHDL: 145.33
Total CHOL/HDL Ratio: 4
Triglycerides: 210 mg/dL — ABNORMAL HIGH (ref 0.0–149.0)
VLDL: 42 mg/dL — ABNORMAL HIGH (ref 0.0–40.0)

## 2022-10-02 LAB — LDL CHOLESTEROL, DIRECT: Direct LDL: 122 mg/dL

## 2022-10-02 LAB — TSH: TSH: 1.72 u[IU]/mL (ref 0.35–5.50)

## 2022-10-02 MED ORDER — DICLOFENAC SODIUM 75 MG PO TBEC: 75.0000 mg | DELAYED_RELEASE_TABLET | Freq: Two times a day (BID) | ORAL | 2 refills | Status: DC

## 2022-10-02 NOTE — Assessment & Plan Note (Addendum)
I reviewed his labs from [facility], his LDL before treatment was 191, it improved significantly with medication. I advised that it could potentially be causing the knee pain, however pt does not wish to stop the medication at this time. Will order CMP and lipid panel today for surveillance.

## 2022-10-02 NOTE — Assessment & Plan Note (Signed)
Tenderness to palpation of the patellas, patellar tendons BL and the tibial tuberosity on both sides. It could be patellar tendontitis vs patellofemoral syndrome. I will rx diclofenac 75 mg BID for the pain and obtain x-rays of both knee.

## 2022-10-02 NOTE — Progress Notes (Signed)
New Patient Office Visit  Subjective    Patient ID: Julian Dickson, male    DOB: May 12, 1980  Age: 42 y.o. MRN: [id number]  CC:  Chief Complaint  Patient presents with   Establish Care    HPI [name] presents to establish care. He is accompanied by the interpreter who is helping with communication in today's visit.  Patient is having pain in both of his knees. Started about 6 months ago. States that it hurts with walking, but also happens all the time, even at night. Has been using muscle rubs which helps some. It is mostly anterior knee pain over the knee caps and just slightly inferior to the knee caps, it does hurt to touch as well. No knee instability. States he had x-rays done in [place] and was told he had a cartilage problem on the right but now both knees are hurting. No joint instability, no acute trauma.  states that for 2 years he has been having some chest pain as well. His records show a history of PDA from previous notes that I have reviewed.  States that he was taking medication for this and it was helping, however he is off the medication currently. Cannot remember the name of the medication. States he was told he was having panic attacks. I reviewed the notes from an ED visit in 2022, it was recommended that he start a PPI. Patient states that he took this medication but it did not help. States that it started with anxiety and nervousness, however it is happening now with regularity without feeling worried. Not as strong as before but it is happening more often. No other associated symptoms, but feels like a pressure. States that he is getting out of breath after 20 minutes of exercise. States that with his knee pain he has not been exercising as much. States that he has been to see a cardiologist in the past, was diagnosed with high cholesterol, states that in [place] he was told he needed surgery "in the future".  Had an ECHO and stress test in 2022 which was normal. Was told it was  not coming from his heart.    Current Outpatient Medications  Medication Instructions   atorvastatin (LIPITOR) 10 mg, Oral, Daily   diclofenac (VOLTAREN) 75 mg, Oral, 2 times daily, Take with food.    Past Medical History:  Diagnosis Date   Hyperlipidemia    PDA (patent ductus arteriosus)     History reviewed. No pertinent surgical history.  Family History  Problem Relation Age of Onset   Heart disease Father    CVA Father     Social History   Socioeconomic History   Marital status: Married    Spouse name: Not on file   Number of children: Not on file   Years of education: Not on file   Highest education level: Not on file  Occupational History   Not on file  Tobacco Use   Smoking status: Never   Smokeless tobacco: Never  Vaping Use   Vaping status: Never Used  Substance and Sexual Activity   Alcohol use: Never   Drug use: Never   Sexual activity: Yes  Other Topics Concern   Not on file  Social History Narrative   Not on file   Social Determinants of Health   Financial Resource Strain: Not on file  Food Insecurity: No Food Insecurity (03/16/2021)   Received from [facility], [facility]   Hunger Vital Sign  Worried About Programme researcher, broadcasting/film/video in the Last Year: Never true    Ran Out of Food in the Last Year: Never true  Transportation Needs: Not on file  Physical Activity: Not on file  Stress: Not on file  Social Connections: Unknown (07/13/2021)   Received from [facility], [facility]   Social Network    Social Network: Not on file  Intimate Partner Violence: Unknown (06/06/2021)   Received from [facility], [facility]   HITS    Physically Hurt: Not on file    Insult or Talk Down To: Not on file    Threaten Physical Harm: Not on file    Scream or Curse: Not on file    Review of Systems  All other systems reviewed and are negative.       Objective    BP 120/80 (BP Location: Left Arm, Patient Position: Sitting, Cuff Size: Normal)    Pulse 75   Temp 98.3 F (36.8 C) (Oral)   Ht 5\' 7"  (1.702 m)   Wt 145 lb (65.8 kg)   SpO2 98%   BMI 22.71 kg/m   Physical Exam Vitals reviewed.  Constitutional:      Appearance: Normal appearance. He is well-groomed and normal weight.  Eyes:     Extraocular Movements: Extraocular movements intact.     Conjunctiva/sclera: Conjunctivae normal.  Neck:     Thyroid: No thyromegaly.  Cardiovascular:     Rate and Rhythm: Normal rate and regular rhythm.     Heart sounds: S1 normal and S2 normal. No murmur heard. Pulmonary:     Effort: Pulmonary effort is normal.     Breath sounds: Normal breath sounds and air entry. No rales.  Abdominal:     General: Abdomen is flat. Bowel sounds are normal.  Musculoskeletal:        General: Tenderness (patelofemoral tendon on both sides below the patella and also where it inserts on the tibial tuberosity) present. Normal range of motion.     Right lower leg: No edema.     Left lower leg: No edema.  Neurological:     General: No focal deficit present.     Mental Status: He is alert and oriented to person, place, and time.     Gait: Gait is intact.  Psychiatric:        Mood and Affect: Mood and affect normal.         Assessment & Plan:  Mixed hyperlipidemia Assessment & Plan: I reviewed his labs from [facility], his LDL before treatment was 191, it improved significantly with medication. I advised that it could potentially be causing the knee pain, however pt does not wish to stop the medication at this time. Will order CMP and lipid panel today for surveillance.   Orders: -     Lipid panel  Anxiety Assessment & Plan: Could be causing the anxiety, the PPI did not help and he denies any heart burn or acid reflux.  Checking TSH today  Orders: -     CBC with Differential/Platelet -     TSH -     Comprehensive metabolic panel  Atypical chest pain -     CBC with Differential/Platelet -     TSH -     Comprehensive metabolic  panel  Bilateral anterior knee pain Assessment & Plan: Tenderness to palpation of the patellas, patellar tendons BL and the tibial tuberosity on both sides. It could be patellar tendontitis vs patellofemoral syndrome. I will rx  diclofenac 75 mg BID for the pain and obtain x-rays of both knee.   Orders: -     DG Knee 1-2 Views Left; Future -     DG Knee 1-2 Views Right; Future -     Diclofenac Sodium; Take 1 tablet (75 mg total) by mouth 2 (two) times daily. Take with food.  Dispense: 60 tablet; Refill: 2  Other orders -     LDL cholesterol, direct  I spent 45 minutes with the patient today using an interpreter to help with communication, I reviewed his symptoms, previous ER and cardiology notes, ECHO results and previous labs.   Return in about 3 months (around 01/02/2023) for follow up on knee pain.   Karie Georges, MD

## 2022-10-02 NOTE — Assessment & Plan Note (Signed)
Could be causing the anxiety, the PPI did not help and he denies any heart burn or acid reflux.  Checking TSH today

## 2022-10-05 DIAGNOSIS — Z419 Encounter for procedure for purposes other than remedying health state, unspecified: Secondary | ICD-10-CM | POA: Diagnosis not present

## 2022-10-17 ENCOUNTER — Telehealth: Payer: Self-pay

## 2022-10-17 NOTE — Telephone Encounter (Signed)
*  Primary  Pharmacy Patient Advocate Encounter   Received notification from CoverMyMeds that prior authorization for Diclofenac Sodium 75MG  dr tablets  is required/requested.   Insurance verification completed.   The patient is insured through [facility] .   Per test claim: PA required; PA submitted to [facility] via CoverMyMeds Key/confirmation #/EOC [id number] Status is pending

## 2022-10-20 NOTE — Telephone Encounter (Signed)
Pharmacy Patient Advocate Encounter  Received notification from [facility] that Prior Authorization for Diclofenac Sodium has been APPROVED from 10/17/2022 to 10/17/2023

## 2022-11-05 DIAGNOSIS — Z419 Encounter for procedure for purposes other than remedying health state, unspecified: Secondary | ICD-10-CM | POA: Diagnosis not present

## 2022-12-05 DIAGNOSIS — Z419 Encounter for procedure for purposes other than remedying health state, unspecified: Secondary | ICD-10-CM | POA: Diagnosis not present

## 2022-12-10 ENCOUNTER — Other Ambulatory Visit: Payer: Self-pay | Admitting: Family Medicine

## 2022-12-11 ENCOUNTER — Other Ambulatory Visit (HOSPITAL_BASED_OUTPATIENT_CLINIC_OR_DEPARTMENT_OTHER): Payer: Self-pay

## 2022-12-11 MED ORDER — ATORVASTATIN CALCIUM 10 MG PO TABS
10.0000 mg | ORAL_TABLET | Freq: Every day | ORAL | 1 refills | Status: DC
  Filled 2022-12-11: qty 90, 90d supply, fill #0
  Filled 2023-04-06 – 2023-04-07 (×2): qty 90, 90d supply, fill #1

## 2023-01-02 ENCOUNTER — Ambulatory Visit: Payer: Medicaid Other | Admitting: Family Medicine

## 2023-01-02 ENCOUNTER — Encounter: Payer: Self-pay | Admitting: Family Medicine

## 2023-01-02 VITALS — BP 100/70 | HR 75 | Temp 98.6°F | Ht 67.0 in | Wt 147.9 lb

## 2023-01-02 DIAGNOSIS — E559 Vitamin D deficiency, unspecified: Secondary | ICD-10-CM | POA: Diagnosis not present

## 2023-01-02 DIAGNOSIS — R202 Paresthesia of skin: Secondary | ICD-10-CM

## 2023-01-02 DIAGNOSIS — M25561 Pain in right knee: Secondary | ICD-10-CM

## 2023-01-02 DIAGNOSIS — M8580 Other specified disorders of bone density and structure, unspecified site: Secondary | ICD-10-CM

## 2023-01-02 DIAGNOSIS — M25562 Pain in left knee: Secondary | ICD-10-CM

## 2023-01-02 LAB — VITAMIN D 25 HYDROXY (VIT D DEFICIENCY, FRACTURES): VITD: 12.5 ng/mL — ABNORMAL LOW (ref 30.00–100.00)

## 2023-01-02 LAB — VITAMIN B12: Vitamin B-12: 296 pg/mL (ref 211–911)

## 2023-01-02 MED ORDER — DICLOFENAC SODIUM 75 MG PO TBEC: 75.0000 mg | DELAYED_RELEASE_TABLET | Freq: Two times a day (BID) | ORAL | 2 refills | Status: DC

## 2023-01-02 NOTE — Progress Notes (Unsigned)
   Established Patient Office Visit  Subjective   Patient ID: Julian Dickson, male    DOB: 1980/11/13  Age: 42 y.o. MRN: [id number]  Chief Complaint  Patient presents with  . Knee Pain    Patient complains of recurrent bilateral knee pain, feels the same and is not taking Diclofenac that was prescibed    Pt is here for follow up on his knee pain. He reports that the pain is still present, not any worse however. We discussed the results of his x-rays and labs. Pt also reports a constant feeling of coldness in his legs and feet. States that he has to wear thermal clothing under his regular clothes even in the summer. We discussed further testing including checking vitamin levels and possible needing an EMG to look at the nerves.    Current Outpatient Medications  Medication Instructions  . atorvastatin (LIPITOR) 10 mg, Oral, Daily  . diclofenac (VOLTAREN) 75 mg, Oral, 2 times daily, Take with food.    Patient Active Problem List   Diagnosis Date Noted  . Anxiety 10/02/2022  . Mixed hyperlipidemia 10/02/2022  . Atypical chest pain 10/02/2022  . Bilateral anterior knee pain 10/02/2022      Review of Systems  All other systems reviewed and are negative.     Objective:     BP 100/70 (BP Location: Left Arm, Patient Position: Sitting, Cuff Size: Normal)   Pulse 75   Temp 98.6 F (37 C) (Oral)   Ht 5\' 7"  (1.702 m)   Wt 147 lb 14.4 oz (67.1 kg)   SpO2 96%   BMI 23.16 kg/m  {Vitals History (Optional):23777}  Physical Exam   No results found for any visits on 01/02/23.  {Labs (Optional):23779}  The 10-year ASCVD risk score (Arnett DK, et al., 2019) is: 1.1%    Assessment & Plan:  Osteopenia determined by x-ray -     VITAMIN D 25 Hydroxy (Vit-D Deficiency, Fractures)  Bilateral anterior knee pain -     Diclofenac Sodium; Take 1 tablet (75 mg total) by mouth 2 (two) times daily. Take with food.  Dispense: 60 tablet; Refill: 2 -     Ambulatory referral to Orthopedic  Surgery  Paresthesia -     Vitamin B12 -     Ambulatory referral to Orthopedic Surgery     No follow-ups on file.    Karie Georges, MD

## 2023-01-03 DIAGNOSIS — R202 Paresthesia of skin: Secondary | ICD-10-CM | POA: Insufficient documentation

## 2023-01-03 DIAGNOSIS — M8580 Other specified disorders of bone density and structure, unspecified site: Secondary | ICD-10-CM | POA: Insufficient documentation

## 2023-01-03 NOTE — Assessment & Plan Note (Signed)
Questionable, pt is describing a constant coldness in his legs, I checked his TSH at the last visit and it was normal. He may need an EMG to look for neuropathy, will check B12 level also today

## 2023-01-03 NOTE — Assessment & Plan Note (Signed)
I am placing a referral to orthopedics for further evaluation of his knee pain as the x-rays did not show any internal derangement

## 2023-01-03 NOTE — Assessment & Plan Note (Signed)
Osteopenic on x-rays,. I will check vitamin D level and replace if low

## 2023-01-04 MED ORDER — VITAMIN D (ERGOCALCIFEROL) 1.25 MG (50000 UNIT) PO CAPS: 50000.0000 [IU] | ORAL_CAPSULE | ORAL | 1 refills | Status: DC

## 2023-01-04 NOTE — Addendum Note (Signed)
Addended by: Karie Georges on: 01/04/2023 10:38 AM   Modules accepted: Orders

## 2023-01-05 DIAGNOSIS — Z419 Encounter for procedure for purposes other than remedying health state, unspecified: Secondary | ICD-10-CM | POA: Diagnosis not present

## 2023-01-23 ENCOUNTER — Encounter: Payer: Self-pay | Admitting: *Deleted

## 2023-02-04 DIAGNOSIS — Z419 Encounter for procedure for purposes other than remedying health state, unspecified: Secondary | ICD-10-CM | POA: Diagnosis not present

## 2023-04-07 ENCOUNTER — Other Ambulatory Visit (HOSPITAL_BASED_OUTPATIENT_CLINIC_OR_DEPARTMENT_OTHER): Payer: Self-pay

## 2023-04-09 ENCOUNTER — Other Ambulatory Visit (HOSPITAL_BASED_OUTPATIENT_CLINIC_OR_DEPARTMENT_OTHER): Payer: Self-pay

## 2023-07-03 ENCOUNTER — Encounter: Payer: Self-pay | Admitting: Family Medicine

## 2023-07-03 ENCOUNTER — Ambulatory Visit (INDEPENDENT_AMBULATORY_CARE_PROVIDER_SITE_OTHER): Admitting: Family Medicine

## 2023-07-03 VITALS — BP 118/80 | HR 73 | Temp 98.2°F | Ht 67.0 in | Wt 147.0 lb

## 2023-07-03 DIAGNOSIS — M25561 Pain in right knee: Secondary | ICD-10-CM

## 2023-07-03 DIAGNOSIS — R3911 Hesitancy of micturition: Secondary | ICD-10-CM

## 2023-07-03 DIAGNOSIS — E782 Mixed hyperlipidemia: Secondary | ICD-10-CM | POA: Diagnosis not present

## 2023-07-03 DIAGNOSIS — M25562 Pain in left knee: Secondary | ICD-10-CM

## 2023-07-03 DIAGNOSIS — L0211 Cutaneous abscess of neck: Secondary | ICD-10-CM

## 2023-07-03 LAB — URINALYSIS, ROUTINE W REFLEX MICROSCOPIC
Bilirubin Urine: NEGATIVE
Ketones, ur: NEGATIVE
Leukocytes,Ua: NEGATIVE
Nitrite: NEGATIVE
RBC / HPF: NONE SEEN (ref 0–?)
Specific Gravity, Urine: >=1.03 — AB (ref 1.000–1.030)
Total Protein, Urine: NEGATIVE
Urine Glucose: 500 — AB
Urobilinogen, UA: 0.2 (ref 0.0–1.0)
pH: 6 (ref 5.0–8.0)

## 2023-07-03 LAB — PSA: PSA: 1.02 ng/mL (ref 0.10–4.00)

## 2023-07-03 MED ORDER — ATORVASTATIN CALCIUM 10 MG PO TABS: 10.0000 mg | ORAL_TABLET | Freq: Every day | ORAL | 1 refills | Status: DC

## 2023-07-03 MED ORDER — DICLOFENAC SODIUM 75 MG PO TBEC: 75.0000 mg | DELAYED_RELEASE_TABLET | Freq: Two times a day (BID) | ORAL | 5 refills | Status: DC

## 2023-07-03 NOTE — Patient Instructions (Addendum)
 Doxycycline 1 capsule twice a day for 7 days   Change the dressing once daily until it stops draining fluid  Use heating pads 20 minutes at a time to help encourage drainage.

## 2023-07-03 NOTE — Progress Notes (Signed)
 Acute Office Visit  Subjective:     Patient ID: Julian Dickson, male    DOB: [birth year], 43 y.o.   MRN: [id number]  Chief Complaint  Patient presents with   Mass    Patient complains of cyst noted posterior neck line x1 year, painful x3 weeks    Pt states that for the last year has been having trouble going to the bathroom, states that he feels the urge to go to the bathroom but nothing comes out. Happens mostly in the morning but also at night. No fever or chills, no pain with urination, no blood in urine.no fever or chills, no pain with urination  Pt is also reports continued pain in his knees. States that it is getting more difficult to walk, more pain going up and down stairs, we reviewed his x-rays together today, I recommended referral to orthopedics   Patient is in today for a cyst on his neck, states that it has never caused him pain befire, just in the last 3 weeks, states that it does hurt to move his neck, maybe some pus that has come out   Review of Systems  Constitutional:  Negative for chills, fever and malaise/fatigue.  All other systems reviewed and are negative.       Objective:    BP 118/80   Pulse 73   Temp 98.2 F (36.8 C) (Oral)   Ht 5\' 7"  (1.702 m)   Wt 147 lb (66.7 kg)   SpO2 98%   BMI 23.02 kg/m    Physical Exam Vitals reviewed.  Constitutional:      Appearance: Normal appearance. He is normal weight.  Skin:    Findings: Lesion (about a 3 cm abscess noted in the occipital hairline on the left upper posterior neck. there is fluctuance in the center of the abscess with surrounding erythema) present.  Neurological:     Mental Status: He is alert.   I & D  Date/Time: 07/03/2023 2:19 PM  Performed by: Aida House, MD Authorized by: Aida House, MD   Consent:    Consent obtained:  Verbal   Consent given by:  Patient   Risks, benefits, and alternatives were discussed: yes     Risks discussed:  Incomplete drainage, infection and  bleeding   Alternatives discussed:  Observation Location:    Type:  Abscess   Size:  3 cm   Location:  Neck Pre-procedure details:    Skin preparation:  Alcohol Sedation:    Sedation type:  None Anesthesia:    Anesthesia method:  Local infiltration   Local anesthetic:  Lidocaine 1% WITH epi Procedure type:    Complexity:  Simple Procedure details:    Incision types:  Stab incision   Incision depth:  Dermal   Scalpel blade:  15   Drainage:  Purulent   Drainage amount:  Scant   Wound treatment:  Wound left open   Packing materials:  None Post-procedure details:    Procedure completion:  Tolerated well, no immediate complications    No results found for any visits on 07/03/23.      Assessment & Plan:   Problem List Items Addressed This Visit       Unprioritized   Mixed hyperlipidemia   Relevant Medications   Reviewed last lipid panel in July 2024, continue statin medication  atorvastatin  (LIPITOR) 10 MG tablet   Bilateral anterior knee pain   Relevant Medications   Pt did not pick up the NSAIDS I gave  him previously, we reviewed his x-rays which showed osteopenic bones (pt had vitamin D  deficiency). Will send to ortho for further recommendations/ imaging.   diclofenac  (VOLTAREN ) 75 MG EC tablet   Other Relevant Orders   Ambulatory referral to Orthopedic Surgery  Difficulty urinating  Unclear etiolgy, will obtain UA and PSA to begin work up. Further orders TBD   Other Visit Diagnoses       Cutaneous abscess of neck    -  Primary     Abscess most likely and infected sebaceous cyst. 1-2 ml of purlent fluid expressed. Wound left opened and wound care instructions were given to the patient and wife. He already has a bottle of doxycycline that was prescribed to him 'by a friend" I advised he take this medication for 7 days.   Meds ordered this encounter  Medications   atorvastatin  (LIPITOR) 10 MG tablet    Sig: Take 1 tablet (10 mg total) by mouth daily.     Dispense:  90 tablet    Refill:  1   diclofenac  (VOLTAREN ) 75 MG EC tablet    Sig: Take 1 tablet (75 mg total) by mouth 2 (two) times daily. Take with food.    Dispense:  60 tablet    Refill:  5    Pt speaks [place]. Take with food    Return in about 6 months (around 01/02/2024) for annual physical exam.  Aida House, MD

## 2023-07-13 ENCOUNTER — Encounter: Payer: Self-pay | Admitting: Family Medicine

## 2023-07-13 ENCOUNTER — Ambulatory Visit (INDEPENDENT_AMBULATORY_CARE_PROVIDER_SITE_OTHER): Admitting: Family Medicine

## 2023-07-13 VITALS — BP 120/70 | HR 73 | Temp 98.6°F | Ht 66.0 in | Wt 148.2 lb

## 2023-07-13 DIAGNOSIS — E559 Vitamin D deficiency, unspecified: Secondary | ICD-10-CM | POA: Diagnosis not present

## 2023-07-13 DIAGNOSIS — R3121 Asymptomatic microscopic hematuria: Secondary | ICD-10-CM

## 2023-07-13 DIAGNOSIS — R81 Glycosuria: Secondary | ICD-10-CM

## 2023-07-13 DIAGNOSIS — E790 Hyperuricemia without signs of inflammatory arthritis and tophaceous disease: Secondary | ICD-10-CM

## 2023-07-13 DIAGNOSIS — E782 Mixed hyperlipidemia: Secondary | ICD-10-CM

## 2023-07-13 DIAGNOSIS — L301 Dyshidrosis [pompholyx]: Secondary | ICD-10-CM

## 2023-07-13 LAB — POCT GLYCOSYLATED HEMOGLOBIN (HGB A1C): Hemoglobin A1C: 6 % — AB (ref 4.0–5.6)

## 2023-07-13 LAB — COMPREHENSIVE METABOLIC PANEL WITH GFR
ALT: 17 U/L (ref 0–53)
AST: 18 U/L (ref 0–37)
Albumin: 4.5 g/dL (ref 3.5–5.2)
Alkaline Phosphatase: 81 U/L (ref 39–117)
BUN: 10 mg/dL (ref 6–23)
CO2: 27 meq/L (ref 19–32)
Calcium: 9.2 mg/dL (ref 8.4–10.5)
Chloride: 105 meq/L (ref 96–112)
Creatinine, Ser: 0.87 mg/dL (ref 0.40–1.50)
GFR: 104.41 mL/min (ref >60.00)
Glucose, Bld: 113 mg/dL — ABNORMAL HIGH (ref 70–99)
Potassium: 3.7 meq/L (ref 3.5–5.1)
Sodium: 142 meq/L (ref 135–145)
Total Bilirubin: 0.8 mg/dL (ref 0.2–1.2)
Total Protein: 7 g/dL (ref 6.0–8.3)

## 2023-07-13 LAB — LIPID PANEL
Cholesterol: 133 mg/dL (ref 0–200)
HDL: 49.1 mg/dL (ref >39.00)
LDL Cholesterol: 47 mg/dL (ref 0–99)
NonHDL: 83.69
Total CHOL/HDL Ratio: 3
Triglycerides: 181 mg/dL — ABNORMAL HIGH (ref 0.0–149.0)
VLDL: 36.2 mg/dL (ref 0.0–40.0)

## 2023-07-13 LAB — URIC ACID: Uric Acid, Serum: 5.3 mg/dL (ref 4.0–7.8)

## 2023-07-13 LAB — VITAMIN D 25 HYDROXY (VIT D DEFICIENCY, FRACTURES): VITD: 31.65 ng/mL (ref 30.00–100.00)

## 2023-07-13 MED ORDER — TRIAMCINOLONE ACETONIDE 0.1 % EX CREA: 1.0000 | TOPICAL_CREAM | Freq: Two times a day (BID) | CUTANEOUS | 0 refills | Status: DC

## 2023-07-13 NOTE — Progress Notes (Unsigned)
   Established Patient Office Visit  Subjective   Patient ID: Julian Dickson, male    DOB: October 03, 1980  Age: 43 y.o. MRN: [id number]  Chief Complaint  Patient presents with  . Results    Patient presents to discuss lab test results from April 29th    Pt is here to discuss labs that were done before. Pt had small blood, uric acid crystals, and    Current Outpatient Medications  Medication Instructions  . atorvastatin  (LIPITOR) 10 mg, Oral, Daily  . diclofenac  (VOLTAREN ) 75 mg, Oral, 2 times daily, Take with food.  . triamcinolone cream (KENALOG) 0.1 % 1 Application, Topical, 2 times daily  . Vitamin D  (Ergocalciferol ) (DRISDOL ) 50,000 Units, Oral, Every 7 days    Patient Active Problem List   Diagnosis Date Noted  . Osteopenia determined by x-ray 01/03/2023  . Paresthesia 01/03/2023  . Anxiety 10/02/2022  . Mixed hyperlipidemia 10/02/2022  . Atypical chest pain 10/02/2022  . Bilateral anterior knee pain 10/02/2022      ROS    Objective:     BP 120/70   Pulse 73   Temp 98.6 F (37 C) (Oral)   Ht 5\' 6"  (1.676 m)   Wt 148 lb 3.2 oz (67.2 kg)   SpO2 97%   BMI 23.92 kg/m  {Vitals History (Optional):23777}  Physical Exam   No results found for any visits on 07/13/23.  {Labs (Optional):23779}  The 10-year ASCVD risk score (Arnett DK, et al., 2019) is: 1.7%    Assessment & Plan:  Asymptomatic microscopic hematuria -     Urinalysis w microscopic + reflex cultur; Future  Glucosuria -     POCT glycosylated hemoglobin (Hb A1C) -     Collection capillary blood specimen  Vitamin D  deficiency -     VITAMIN D  25 Hydroxy (Vit-D Deficiency, Fractures); Future  Hyperuricemia -     Uric acid; Future  Dyshidrotic eczema -     Triamcinolone Acetonide; Apply 1 Application topically 2 (two) times daily.  Dispense: 45 g; Refill: 0     No follow-ups on file.    Aida House, MD

## 2023-07-14 LAB — URINALYSIS W MICROSCOPIC + REFLEX CULTURE
Bacteria, UA: NONE SEEN /HPF
Bilirubin Urine: NEGATIVE
Glucose, UA: NEGATIVE
Hgb urine dipstick: NEGATIVE
Hyaline Cast: NONE SEEN /LPF
Ketones, ur: NEGATIVE
Leukocyte Esterase: NEGATIVE
Nitrites, Initial: NEGATIVE
Protein, ur: NEGATIVE
RBC / HPF: NONE SEEN /HPF (ref 0–2)
Specific Gravity, Urine: 1.006 (ref 1.001–1.035)
Squamous Epithelial / HPF: NONE SEEN /HPF (ref <=5)
WBC, UA: NONE SEEN /HPF (ref 0–5)
pH: 7.5 (ref 5.0–8.0)

## 2023-07-14 LAB — NO CULTURE INDICATED

## 2023-07-18 ENCOUNTER — Ambulatory Visit: Payer: Self-pay | Admitting: *Deleted

## 2023-08-01 ENCOUNTER — Ambulatory Visit: Admitting: Orthopaedic Surgery

## 2023-08-01 DIAGNOSIS — M25561 Pain in right knee: Secondary | ICD-10-CM | POA: Diagnosis not present

## 2023-08-01 DIAGNOSIS — G8929 Other chronic pain: Secondary | ICD-10-CM | POA: Diagnosis not present

## 2023-08-01 DIAGNOSIS — M25562 Pain in left knee: Secondary | ICD-10-CM

## 2023-08-01 MED ORDER — METHYLPREDNISOLONE ACETATE 40 MG/ML IJ SUSP
40.0000 mg | INTRAMUSCULAR | Status: AC | PRN
  Administered 2023-08-01: 40 mg via INTRA_ARTICULAR

## 2023-08-01 MED ORDER — METHYLPREDNISOLONE ACETATE 40 MG/ML IJ SUSP
40.0000 mg | INTRAMUSCULAR | Status: AC | PRN
Start: 2023-08-01 — End: 2023-08-01
  Administered 2023-08-01: 40 mg via INTRA_ARTICULAR

## 2023-08-01 MED ORDER — LIDOCAINE HCL 1 % IJ SOLN
3.0000 mL | INTRAMUSCULAR | Status: AC | PRN
  Administered 2023-08-01: 3 mL

## 2023-08-01 NOTE — Progress Notes (Signed)
 The patient is a 43 year old gentleman I am seeing for the first time.  He is non-English speaking and has an interpreter with him.  He comes in with chronic bilateral knee pain with no known injury this been going on for about 2 years now.  X-rays on the canopy system for last year were negative for any type of acute injury or malalignment.  He has been on diclofenac .  He has a constant ache in both knees that is more mechanical related in terms of his knees.  Examination of both knees today shows no warmth.  There is no effusion today.  Range of motion is full and both knees are ligamentously stable.  X-rays from last year show both knees.  There is dictation said there was possibly a small effusion of either knee.  I do not see any effusion today.  Being on the conservative side of things we are going to try steroid injection of both knees today to see if this will help.  He has been on diclofenac  so he will continue this as needed.  He did tolerate the steroid injections well in both knees and we will see him back in a month to see how he is respond to those injections.  After that we can determine other treatment modalities or imaging studies if needed if he does not have response.    Procedure Note  Patient: Julian Dickson             Date of Birth: 1980-12-20           MRN: [id number]             Visit Date: 08/01/2023  Procedures: Visit Diagnoses:  1. Chronic pain of right knee   2. Chronic pain of left knee     Large Joint Inj: R knee on 08/01/2023 2:04 PM Indications: diagnostic evaluation and pain Details: 22 G 1.5 in needle, superolateral approach  Arthrogram: No  Medications: 3 mL lidocaine 1 %; 40 mg methylPREDNISolone acetate 40 MG/ML Outcome: tolerated well, no immediate complications Procedure, treatment alternatives, risks and benefits explained, specific risks discussed. Consent was given by the patient. Immediately prior to procedure a time out was called to verify the  correct patient, procedure, equipment, support staff and site/side marked as required. Patient was prepped and draped in the usual sterile fashion.    Large Joint Inj: L knee on 08/01/2023 2:04 PM Indications: diagnostic evaluation and pain Details: 22 G 1.5 in needle, superolateral approach  Arthrogram: No  Medications: 3 mL lidocaine 1 %; 40 mg methylPREDNISolone acetate 40 MG/ML Outcome: tolerated well, no immediate complications Procedure, treatment alternatives, risks and benefits explained, specific risks discussed. Consent was given by the patient. Immediately prior to procedure a time out was called to verify the correct patient, procedure, equipment, support staff and site/side marked as required. Patient was prepped and draped in the usual sterile fashion.

## 2023-09-03 ENCOUNTER — Ambulatory Visit (INDEPENDENT_AMBULATORY_CARE_PROVIDER_SITE_OTHER): Admitting: Orthopaedic Surgery

## 2023-09-03 DIAGNOSIS — M25561 Pain in right knee: Secondary | ICD-10-CM | POA: Diagnosis not present

## 2023-09-03 DIAGNOSIS — G8929 Other chronic pain: Secondary | ICD-10-CM | POA: Diagnosis not present

## 2023-09-03 DIAGNOSIS — M25562 Pain in left knee: Secondary | ICD-10-CM

## 2023-09-03 NOTE — Progress Notes (Signed)
 The patient very pleasant 43 year old gentleman I am seeing for second time.  He has been dealing with chronic bilateral knee pain for the last 2 years with no known injury.  He used to wake up with pain at night but now that is subsided some but he does have still chronic bilateral knee pain daily.  He is a thin individual and active.  He denies any injuries.  We did place a steroid injection in both knees a month ago and he said that the first 3 days were very painful he could not walk much but then since then it is calmed down a little bit but he still deals with the chronic pain with both knees.  He does not speak [place].  We worked through an interpreter today for this exam.  We have x-rayed both knees before and both knees on x-ray exam are normal.  He is on diclofenac .  He also takes medication for high cholesterol.  He denies any other joints hurting him other than just his knees.  There is not a family history of joint issues either.  He denies any recent illnesses.  On examination both knees have slight varus malalignment that is correctable.  There is no significant effusion of either knees but there is slightly.  He does have just a little bit of calf pain as well.  He has negative Homans' sign bilaterally.  Both knees are ligamentously stable.  Due to the chronic pain he is still experiencing in both knees combined with the failure of conservative treatment for over a year which includes activity modification, anti-inflammatories, steroid injections and quad training exercises, a MRI is warranted of both knees to help try to assess the source of the pain in both knees.  We will see him back in follow-up once we have MRI of both knees.  All questions and concerns were answered to interpret.  They agreed with this treatment plan as well.

## 2023-09-04 ENCOUNTER — Other Ambulatory Visit: Payer: Self-pay

## 2023-09-04 DIAGNOSIS — G8929 Other chronic pain: Secondary | ICD-10-CM

## 2023-10-09 ENCOUNTER — Other Ambulatory Visit: Payer: Self-pay

## 2023-10-09 DIAGNOSIS — G8929 Other chronic pain: Secondary | ICD-10-CM

## 2024-01-02 ENCOUNTER — Ambulatory Visit: Admitting: Family Medicine

## 2024-01-02 ENCOUNTER — Encounter: Payer: Self-pay | Admitting: Family Medicine

## 2024-01-02 VITALS — BP 128/88 | HR 73 | Temp 97.7°F | Ht 67.0 in | Wt 148.1 lb

## 2024-01-02 DIAGNOSIS — E782 Mixed hyperlipidemia: Secondary | ICD-10-CM

## 2024-01-02 DIAGNOSIS — K279 Peptic ulcer, site unspecified, unspecified as acute or chronic, without hemorrhage or perforation: Secondary | ICD-10-CM | POA: Diagnosis not present

## 2024-01-02 DIAGNOSIS — H43393 Other vitreous opacities, bilateral: Secondary | ICD-10-CM | POA: Diagnosis not present

## 2024-01-02 DIAGNOSIS — E559 Vitamin D deficiency, unspecified: Secondary | ICD-10-CM | POA: Diagnosis not present

## 2024-01-02 DIAGNOSIS — Z23 Encounter for immunization: Secondary | ICD-10-CM

## 2024-01-02 DIAGNOSIS — Z0000 Encounter for general adult medical examination without abnormal findings: Secondary | ICD-10-CM | POA: Diagnosis not present

## 2024-01-02 DIAGNOSIS — R202 Paresthesia of skin: Secondary | ICD-10-CM | POA: Diagnosis not present

## 2024-01-02 LAB — VITAMIN B12: Vitamin B-12: 343 pg/mL (ref 211–911)

## 2024-01-02 LAB — LIPID PANEL
Cholesterol: 169 mg/dL (ref 0–200)
HDL: 55.1 mg/dL (ref >39.00)
LDL Cholesterol: 83 mg/dL (ref 0–99)
NonHDL: 113.54
Total CHOL/HDL Ratio: 3
Triglycerides: 154 mg/dL — ABNORMAL HIGH (ref 0.0–149.0)
VLDL: 30.8 mg/dL (ref 0.0–40.0)

## 2024-01-02 LAB — VITAMIN D 25 HYDROXY (VIT D DEFICIENCY, FRACTURES): VITD: 33.45 ng/mL (ref 30.00–100.00)

## 2024-01-02 MED ORDER — ATORVASTATIN CALCIUM 10 MG PO TABS: 10.0000 mg | ORAL_TABLET | Freq: Every day | ORAL | 1 refills | Status: AC

## 2024-01-02 MED ORDER — OMEPRAZOLE 40 MG PO CPDR: 40.0000 mg | DELAYED_RELEASE_CAPSULE | Freq: Every day | ORAL | 3 refills | Status: AC

## 2024-01-02 NOTE — Patient Instructions (Signed)
 Health Maintenance, Male  Adopting a healthy lifestyle and getting preventive care are important in promoting health and wellness. Ask your health care provider about:  The right schedule for you to have regular tests and exams.  Things you can do on your own to prevent diseases and keep yourself healthy.  What should I know about diet, weight, and exercise?  Eat a healthy diet    Eat a diet that includes plenty of vegetables, fruits, low-fat dairy products, and lean protein.  Do not eat a lot of foods that are high in solid fats, added sugars, or sodium.  Maintain a healthy weight  Body mass index (BMI) is a measurement that can be used to identify possible weight problems. It estimates body fat based on height and weight. Your health care provider can help determine your BMI and help you achieve or maintain a healthy weight.  Get regular exercise  Get regular exercise. This is one of the most important things you can do for your health. Most adults should:  Exercise for at least 150 minutes each week. The exercise should increase your heart rate and make you sweat (moderate-intensity exercise).  Do strengthening exercises at least twice a week. This is in addition to the moderate-intensity exercise.  Spend less time sitting. Even light physical activity can be beneficial.  Watch cholesterol and blood lipids  Have your blood tested for lipids and cholesterol at 45 years of age, then have this test every 5 years.  You may need to have your cholesterol levels checked more often if:  Your lipid or cholesterol levels are high.  You are older than 45 years of age.  You are at high risk for heart disease.  What should I know about cancer screening?  Many types of cancers can be detected early and may often be prevented. Depending on your health history and family history, you may need to have cancer screening at various ages. This may include screening for:  Colorectal cancer.  Prostate cancer.  Skin cancer.  Lung  cancer.  What should I know about heart disease, diabetes, and high blood pressure?  Blood pressure and heart disease  High blood pressure causes heart disease and increases the risk of stroke. This is more likely to develop in people who have high blood pressure readings or are overweight.  Talk with your health care provider about your target blood pressure readings.  Have your blood pressure checked:  Every 3-5 years if you are 45-45 years of age.  Every year if you are 3 years old or older.  If you are between the ages of 60 and 72 and are a current or former smoker, ask your health care provider if you should have a one-time screening for abdominal aortic aneurysm (AAA).  Diabetes  Have regular diabetes screenings. This checks your fasting blood sugar level. Have the screening done:  Once every three years after age 45 if you are at a normal weight and have a low risk for diabetes.  More often and at a younger age if you are overweight or have a high risk for diabetes.  What should I know about preventing infection?  Hepatitis B  If you have a higher risk for hepatitis B, you should be screened for this virus. Talk with your health care provider to find out if you are at risk for hepatitis B infection.  Hepatitis C  Blood testing is recommended for:  Everyone born from [birth year] through 1967.  Anyone  with known risk factors for hepatitis C.  Sexually transmitted infections (STIs)  You should be screened each year for STIs, including gonorrhea and chlamydia, if:  You are sexually active and are younger than 45 years of age.  You are older than 45 years of age and your health care provider tells you that you are at risk for this type of infection.  Your sexual activity has changed since you were last screened, and you are at increased risk for chlamydia or gonorrhea. Ask your health care provider if you are at risk.  Ask your health care provider about whether you are at high risk for HIV. Your health care provider  may recommend a prescription medicine to help prevent HIV infection. If you choose to take medicine to prevent HIV, you should first get tested for HIV. You should then be tested every 3 months for as long as you are taking the medicine.  Follow these instructions at home:  Alcohol use  Do not drink alcohol if your health care provider tells you not to drink.  If you drink alcohol:  Limit how much you have to 0-2 drinks a day.  Know how much alcohol is in your drink. In the [place] one drink equals one 12 oz bottle of beer (355 mL), one 5 oz glass of wine (148 mL), or one 1 oz glass of hard liquor (44 mL).  Lifestyle  Do not use any products that contain nicotine or tobacco. These products include cigarettes, chewing tobacco, and vaping devices, such as e-cigarettes. If you need help quitting, ask your health care provider.  Do not use street drugs.  Do not share needles.  Ask your health care provider for help if you need support or information about quitting drugs.  General instructions  Schedule regular health, dental, and eye exams.  Stay current with your vaccines.  Tell your health care provider if:  You often feel depressed.  You have ever been abused or do not feel safe at home.  Summary  Adopting a healthy lifestyle and getting preventive care are important in promoting health and wellness.  Follow your health care provider's instructions about healthy diet, exercising, and getting tested or screened for diseases.  Follow your health care provider's instructions on monitoring your cholesterol and blood pressure.  This information is not intended to replace advice given to you by your health care provider. Make sure you discuss any questions you have with your health care provider.  Document Revised: 07/12/2020 Document Reviewed: 07/12/2020  Elsevier Patient Education  2024 [employer].

## 2024-01-02 NOTE — Progress Notes (Signed)
 Complete physical exam  Patient: Julian Dickson   DOB: 1980-04-29   43 y.o. Male  MRN: [id number]  Subjective:    Chief Complaint  Patient presents with   Annual Exam    Julian Dickson is a 43 y.o. male who presents today for a complete physical exam. He reports consuming a general diet. Eats a lot of white rice, sometimes gets diarrhea with dairy products, does eat veggies and good sources of protein. Home exercise routine includes walking 3 hrs per week. He generally feels well. He reports sleeping well. He does not have additional problems to discuss today.   Patient had his colonoscopy 3 years ago in [place], states it showed 2 polyps and it was recommended that he undergo colonoscopies every 5 years. Also had a history of peptic ulcer disease and he is starting to have more stomach issues/ more symptoms at this time.   Most recent fall risk assessment:     No data to display           Most recent depression screenings:    01/02/2023    9:39 AM 10/02/2022    9:13 AM  PHQ 2/9 Scores  PHQ - 2 Score 0 0  PHQ- 9 Score 1     Vision:Not within last year  and wears glasses, no problems with vision except for mild floaters  and Dental: No current dental problems and Receives regular dental care  Patient Active Problem List   Diagnosis Date Noted   Peptic ulcer disease 01/02/2024   Osteopenia determined by x-ray 01/03/2023   Paresthesia 01/03/2023   Anxiety 10/02/2022   Mixed hyperlipidemia 10/02/2022   Atypical chest pain 10/02/2022   Bilateral anterior knee pain 10/02/2022      Patient Care Team: Ozell Heron HERO, MD as PCP - General (Family Medicine)   Outpatient Medications Prior to Visit  Medication Sig   atorvastatin  (LIPITOR) 10 MG tablet Take 1 tablet (10 mg total) by mouth daily.   Probiotic Product (PROBIOTIC PO) Take by mouth.   VITAMIN D  PO Take 5,000 Units by mouth daily.   [DISCONTINUED] diclofenac  (VOLTAREN ) 75 MG EC tablet Take 1 tablet (75 mg total) by  mouth 2 (two) times daily. Take with food.   [DISCONTINUED] triamcinolone  cream (KENALOG ) 0.1 % Apply 1 Application topically 2 (two) times daily.   [DISCONTINUED] Vitamin D , Ergocalciferol , (DRISDOL ) 1.25 MG (50000 UNIT) CAPS capsule Take 1 capsule (50,000 Units total) by mouth every 7 (seven) days.   No facility-administered medications prior to visit.    Review of Systems  HENT:  Negative for hearing loss.   Eyes:  Negative for blurred vision.  Respiratory:  Negative for shortness of breath.   Cardiovascular:  Negative for chest pain.  Gastrointestinal: Negative.   Genitourinary: Negative.   Musculoskeletal:  Negative for back pain.  Neurological:  Negative for headaches.  Psychiatric/Behavioral:  Negative for depression.   All other systems reviewed and are negative.      Objective:     BP 128/88   Pulse 73   Temp 97.7 F (36.5 C) (Oral)   Ht 5' 7 (1.702 m)   Wt 148 lb 1.6 oz (67.2 kg)   SpO2 95%   BMI 23.20 kg/m    Physical Exam Vitals reviewed.  Constitutional:      Appearance: Normal appearance. He is well-groomed and normal weight.  HENT:     Right Ear: Tympanic membrane and ear canal normal.     Left Ear: Tympanic membrane  and ear canal normal.     Mouth/Throat:     Mouth: Mucous membranes are moist.     Pharynx: No posterior oropharyngeal erythema.  Eyes:     Extraocular Movements: Extraocular movements intact.     Conjunctiva/sclera: Conjunctivae normal.  Neck:     Thyroid: No thyromegaly.  Cardiovascular:     Rate and Rhythm: Normal rate and regular rhythm.     Heart sounds: S1 normal and S2 normal. No murmur heard. Pulmonary:     Effort: Pulmonary effort is normal.     Breath sounds: Normal breath sounds and air entry. No rales.  Abdominal:     General: Abdomen is flat. Bowel sounds are normal.  Musculoskeletal:     Right lower leg: No edema.     Left lower leg: No edema.  Lymphadenopathy:     Cervical: No cervical adenopathy.   Neurological:     General: No focal deficit present.     Mental Status: He is alert and oriented to person, place, and time.     Gait: Gait is intact.  Psychiatric:        Mood and Affect: Mood and affect normal.      No results found for any visits on 01/02/24.     Assessment & Plan:    Routine Health Maintenance and Physical Exam  Immunization History  Administered Date(s) Administered   Hepatitis A, Adult 03/16/2021   Influenza, Seasonal, Injecte, Preservative Fre 01/02/2024   Tdap 05/14/2013, 10/01/2021    Health Maintenance  Topic Date Due   Hepatitis B Vaccines 19-59 Average Risk (1 of 3 - 19+ 3-dose series) Never done   HPV VACCINES (1 - 3-dose SCDM series) Never done   Colonoscopy  Never done   COVID-19 Vaccine (1 - 2025-26 season) Never done   Hepatitis C Screening  01/01/2025 (Originally 04/17/1996)   HIV Screening  01/01/2025 (Originally 04/17/1993)   DTaP/Tdap/Td (3 - Td or Tdap) 10/02/2031   Influenza Vaccine  Completed   Pneumococcal Vaccine  Aged Out   Meningococcal B Vaccine  Aged Out    Discussed health benefits of physical activity, and encouraged him to engage in regular exercise appropriate for his age and condition.  Immunization due -     Flu vaccine trivalent PF, 6mos and older(Flulaval,Afluria,Fluarix,Fluzone)  Peptic ulcer disease -     Ambulatory referral to Gastroenterology -     Omeprazole ; Take 1 capsule (40 mg total) by mouth daily.  Dispense: 30 capsule; Refill: 3  Floaters in visual field, bilateral -     Ambulatory referral to Ophthalmology  Mixed hyperlipidemia -     Lipid panel; Future  Routine general medical examination at a health care facility  Vitamin D  deficiency -     VITAMIN D  25 Hydroxy (Vit-D Deficiency, Fractures); Future  Paresthesia -     Vitamin B12; Future  General physical exam findings are normal today.  Pt reports PUD and vision floaters in the ROS. Appropriate referrals have been placed. I reviewed the  patient's preventative testing, immunizations, and lifestyle habits. I made appropriate recommendations and placed orders for the appropriate tests and/or vaccinations. I counseled the patient on the CDC's recommendations for healthy exercise and diet. I counseled the patient on healthy sleep habits and stress management. Handouts to reinforce the counseling were given at the conclusion of the visit.    Return in 6 months (on 07/02/2024).     Heron CHRISTELLA Sharper, MD

## 2024-01-04 ENCOUNTER — Ambulatory Visit: Payer: Self-pay | Admitting: Family Medicine

## 2024-01-07 ENCOUNTER — Encounter: Payer: Self-pay | Admitting: Radiology

## 2024-06-02 ENCOUNTER — Ambulatory Visit: Admitting: Family Medicine
